# Patient Record
Sex: Male | Born: 1965
Health system: Southern US, Community
[De-identification: ages and names within clinical notes are randomized; demographics above are authoritative.]

## PROBLEM LIST (undated history)

## (undated) DIAGNOSIS — I10 Essential (primary) hypertension: Secondary | ICD-10-CM

## (undated) DIAGNOSIS — G255 Other chorea: Secondary | ICD-10-CM

## (undated) DIAGNOSIS — S8290XA Unspecified fracture of unspecified lower leg, initial encounter for closed fracture: Secondary | ICD-10-CM

## (undated) DIAGNOSIS — S129XXA Fracture of neck, unspecified, initial encounter: Secondary | ICD-10-CM

## (undated) DIAGNOSIS — S7290XA Unspecified fracture of unspecified femur, initial encounter for closed fracture: Secondary | ICD-10-CM

## (undated) HISTORY — DX: Unspecified fracture of unspecified lower leg, initial encounter for closed fracture: S82.90XA

## (undated) HISTORY — PX: ANKLE SURGERY: SHX546

## (undated) HISTORY — PX: BACK SURGERY: SHX140

## (undated) HISTORY — DX: Fracture of neck, unspecified, initial encounter: S12.9XXA

## (undated) HISTORY — DX: Other chorea: G25.5

## (undated) HISTORY — PX: KNEE SURGERY: SHX244

## (undated) HISTORY — DX: Essential (primary) hypertension: I10

## (undated) HISTORY — DX: Unspecified fracture of unspecified femur, initial encounter for closed fracture: S72.90XA

## (undated) HISTORY — PX: LEG SURGERY: SHX1003

---

## 1999-09-25 ENCOUNTER — Ambulatory Visit (HOSPITAL_COMMUNITY): Admission: RE | Admit: 1999-09-25 | Discharge: 1999-09-25 | Payer: Self-pay | Admitting: Orthopedic Surgery

## 2000-01-30 ENCOUNTER — Encounter: Admission: RE | Admit: 2000-01-30 | Discharge: 2000-02-21 | Payer: Self-pay | Admitting: *Deleted

## 2000-03-10 ENCOUNTER — Ambulatory Visit (HOSPITAL_COMMUNITY): Admission: RE | Admit: 2000-03-10 | Discharge: 2000-03-10 | Payer: Self-pay | Admitting: Family Medicine

## 2000-03-10 ENCOUNTER — Encounter: Payer: Self-pay | Admitting: Family Medicine

## 2001-08-12 ENCOUNTER — Encounter: Payer: Self-pay | Admitting: Orthopaedic Surgery

## 2001-08-12 ENCOUNTER — Ambulatory Visit (HOSPITAL_COMMUNITY): Admission: RE | Admit: 2001-08-12 | Discharge: 2001-08-12 | Payer: Self-pay | Admitting: Orthopaedic Surgery

## 2001-09-07 ENCOUNTER — Encounter: Admission: RE | Admit: 2001-09-07 | Discharge: 2001-09-07 | Payer: Self-pay | Admitting: Neurosurgery

## 2001-09-21 ENCOUNTER — Encounter: Admission: RE | Admit: 2001-09-21 | Discharge: 2001-09-21 | Payer: Self-pay | Admitting: Neurosurgery

## 2001-12-10 ENCOUNTER — Ambulatory Visit (HOSPITAL_COMMUNITY): Admission: RE | Admit: 2001-12-10 | Discharge: 2001-12-10 | Payer: Self-pay | Admitting: Neurosurgery

## 2002-01-12 ENCOUNTER — Ambulatory Visit (HOSPITAL_COMMUNITY): Admission: RE | Admit: 2002-01-12 | Discharge: 2002-01-12 | Payer: Self-pay | Admitting: General Surgery

## 2003-07-11 ENCOUNTER — Encounter: Payer: Self-pay | Admitting: Emergency Medicine

## 2003-07-12 ENCOUNTER — Inpatient Hospital Stay (HOSPITAL_COMMUNITY): Admission: EM | Admit: 2003-07-12 | Discharge: 2003-07-15 | Payer: Self-pay | Admitting: Emergency Medicine

## 2004-01-09 ENCOUNTER — Ambulatory Visit: Payer: Self-pay | Admitting: Family Medicine

## 2005-03-04 ENCOUNTER — Encounter: Admission: RE | Admit: 2005-03-04 | Discharge: 2005-04-18 | Payer: Self-pay | Admitting: Orthopaedic Surgery

## 2005-09-21 IMAGING — CR DG CHEST 2V
2 series · 2 of 2 positions shown · non-contrast
Comparison: 9689 hours same day.

CLINICAL DATA: Traumatic right pneumothorax.  
 TWO VIEW CHEST - 07/15/03 AT 1977 HOURS

[view not recorded (1 of 2)]
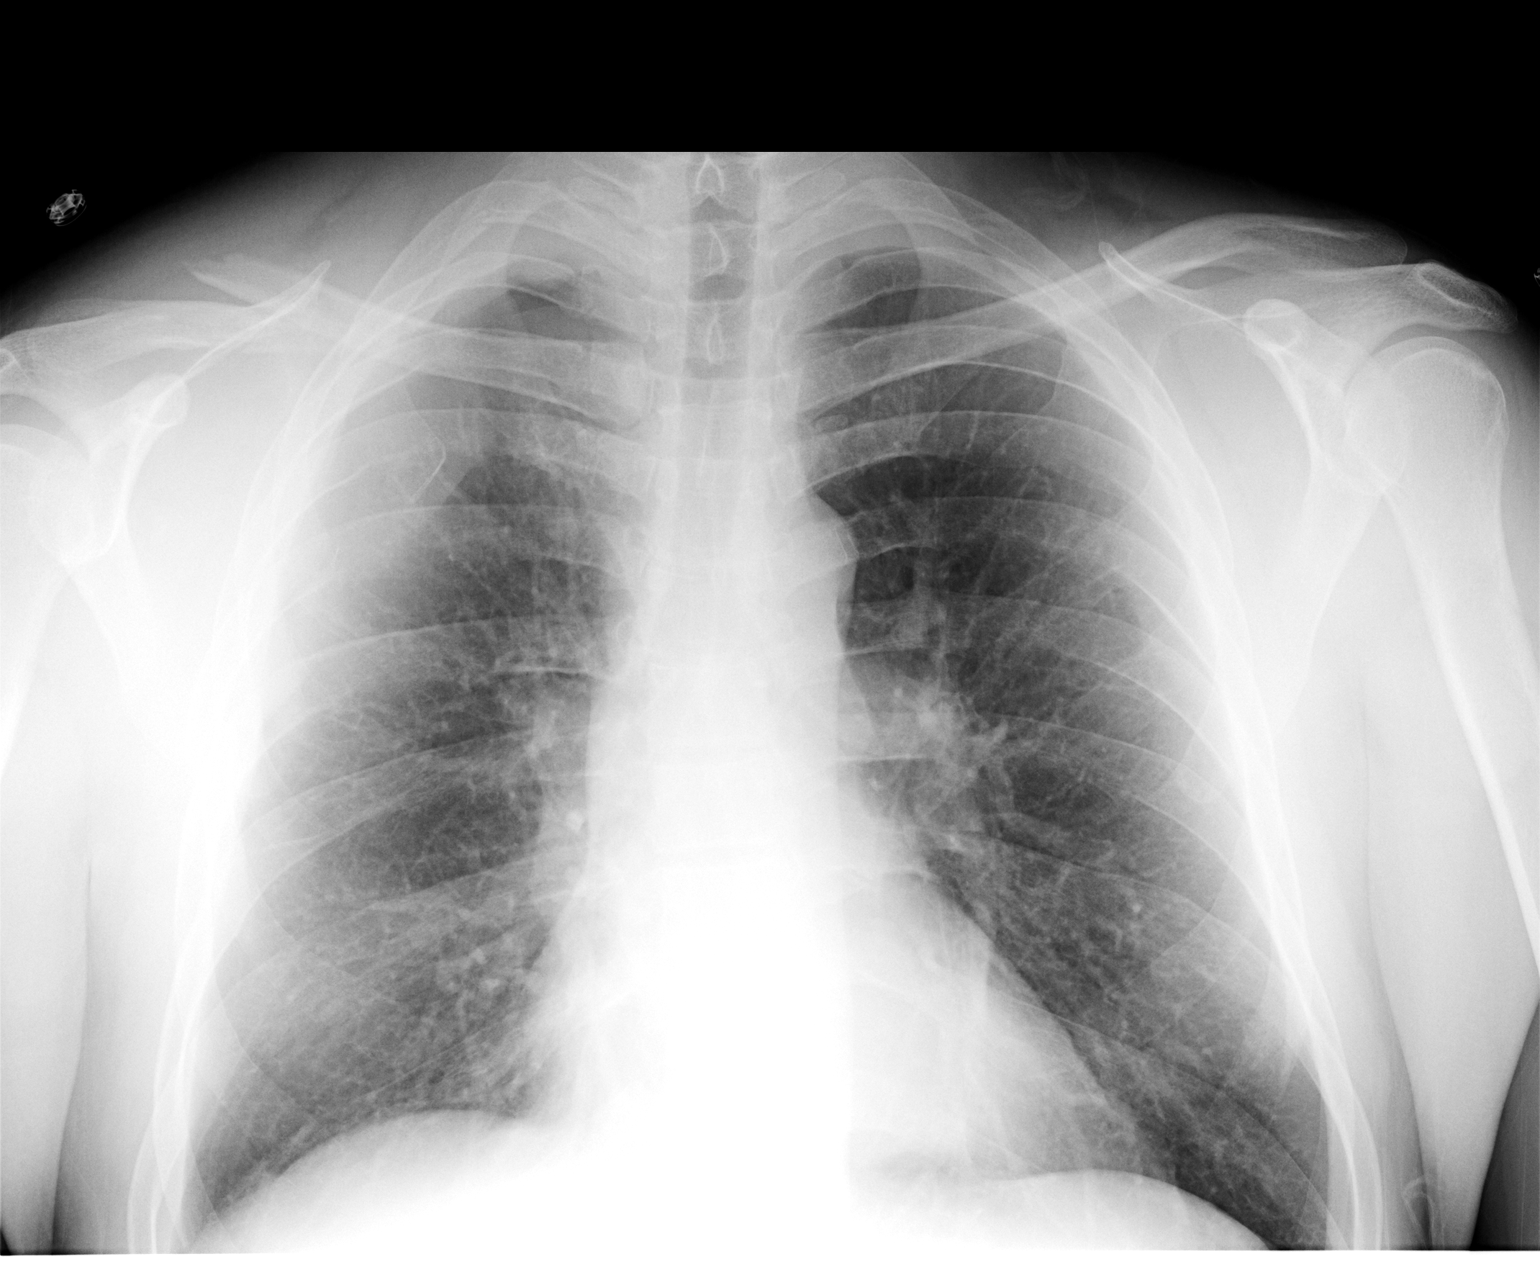

[view not recorded (2 of 2)]
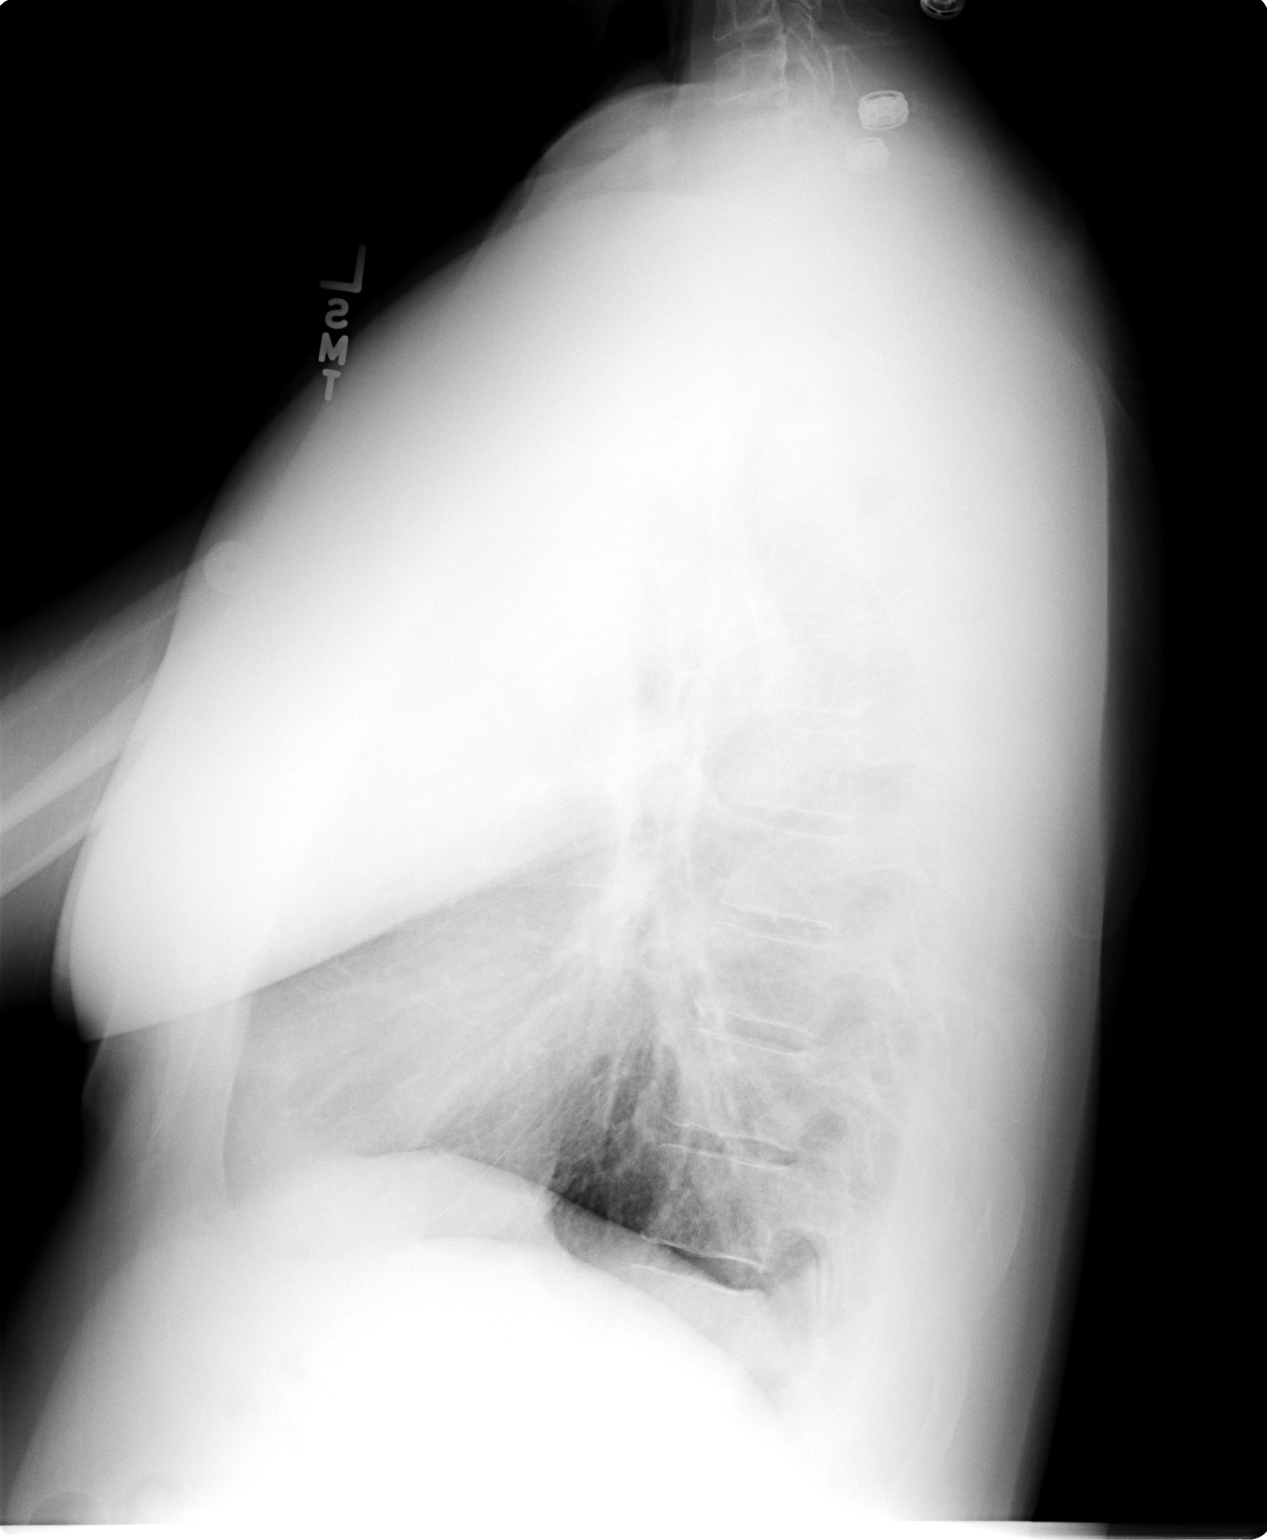

[2 of 2 positions shown; findings below may reference images not displayed]

Stable roughly 10% residual right apical pneumothorax.  Tiny stable right pleural effusion.
 IMPRESSION
 Stable pneumothorax on the right estimated to be 10% in volume.

## 2005-09-21 IMAGING — CR DG CHEST 2V
2 series · 2 of 2 positions shown · non-contrast
Comparison: none

CLINICAL DATA: Traumatic pneumothorax.
TWO VIEW CHEST   - 07/15/03 
Comparison to 07/14/03.

[view not recorded (1 of 2)]
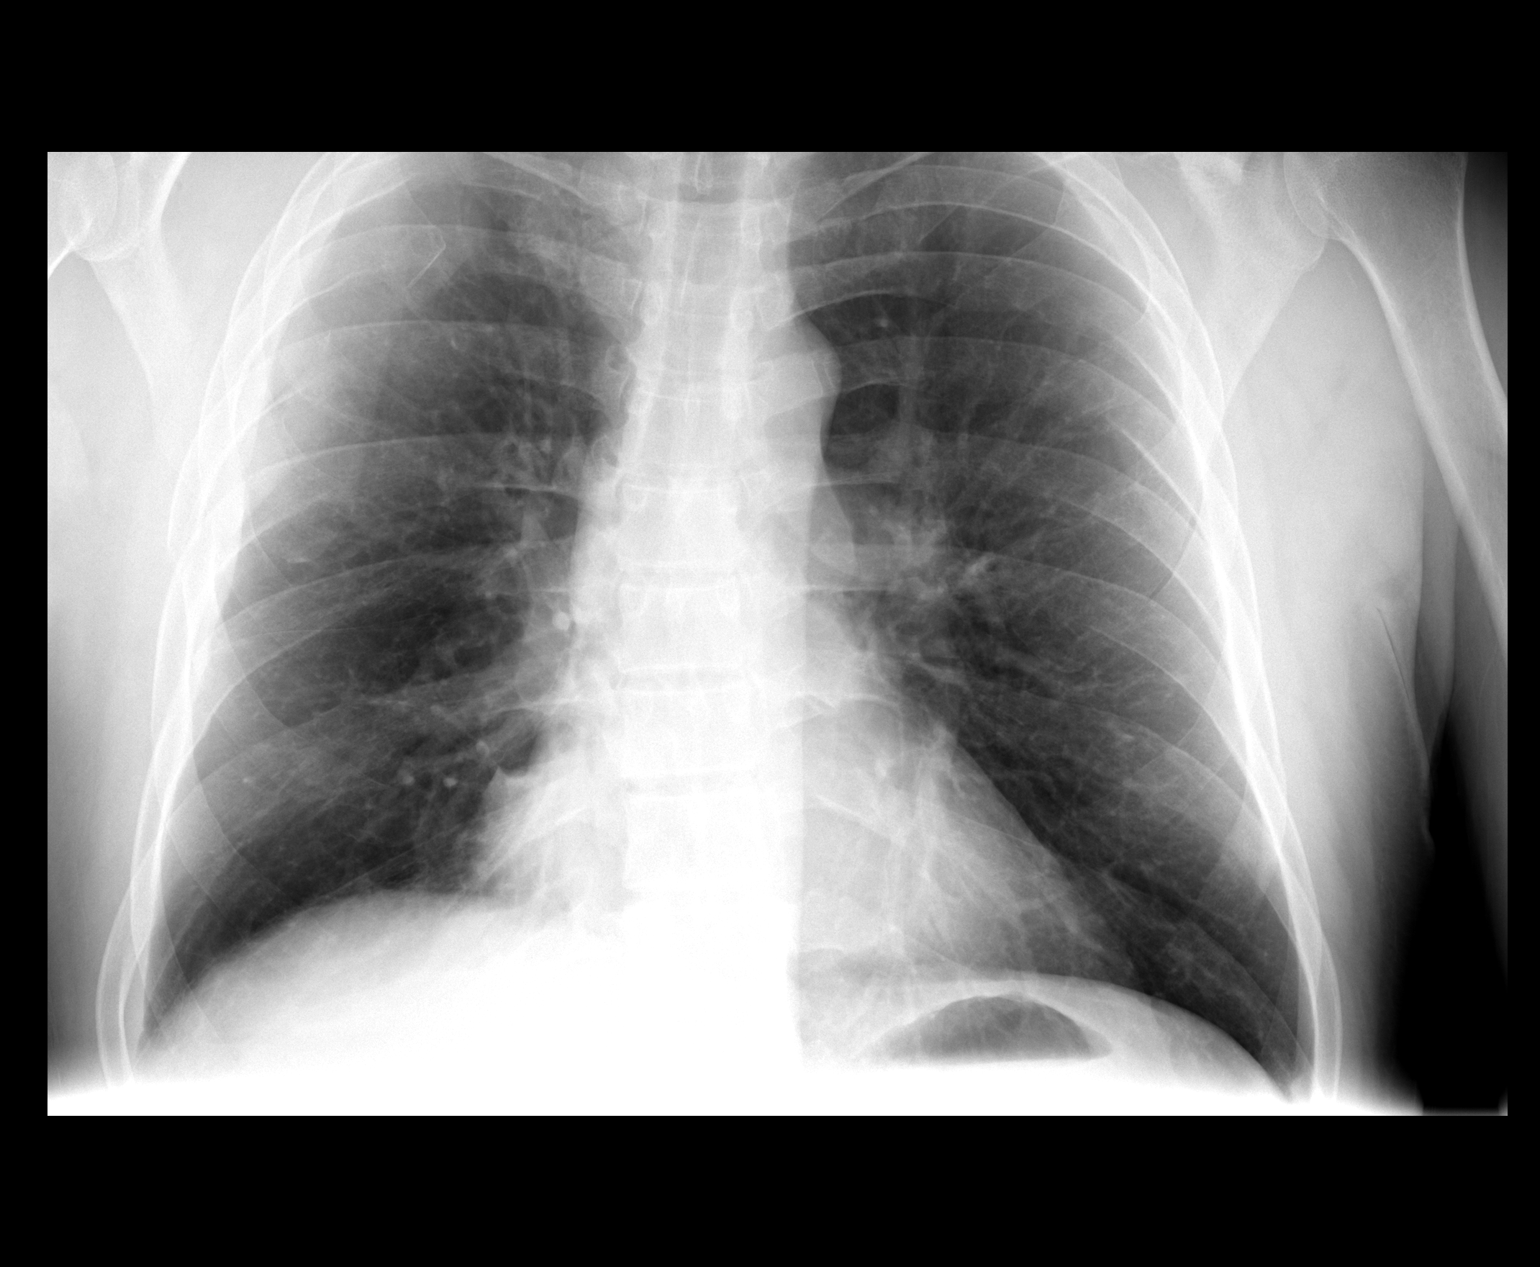

[view not recorded (2 of 2)]
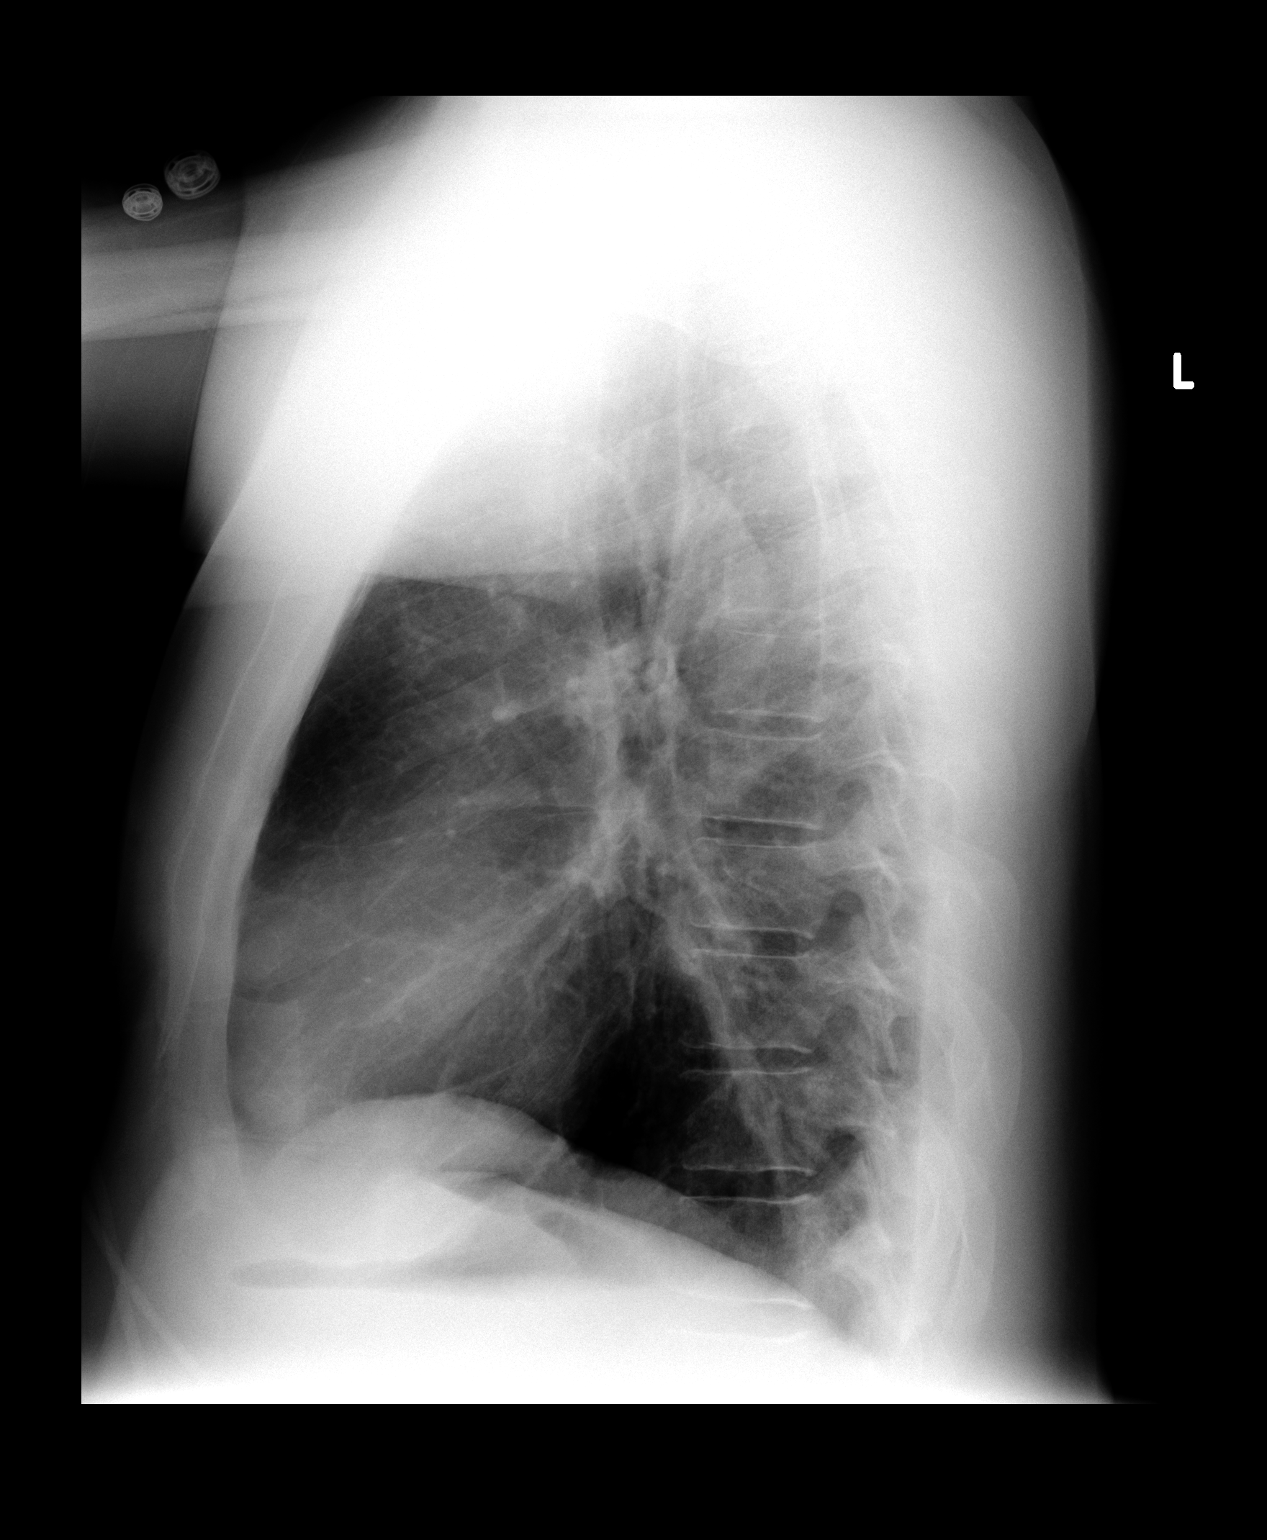

[2 of 2 positions shown; findings below may reference images not displayed]

FINDINGS: The right apical pneumothorax is slightly decreased from the last study now measuring 10 percent.   pleural fluid is again noted with the basilar hydropneumothorax portion is not as well visualized.  Right-sided rib fractures are again present.  New atelectasis/air space disease in the medial right lower lung is noted.
IMPRESSION
1.  Slight decrease in right apical pneumothorax.
2.  Stable right pleural fluid.
3.  Increasing medial right basilar atelectasis/air space disease.

## 2006-05-06 ENCOUNTER — Encounter: Admission: RE | Admit: 2006-05-06 | Discharge: 2006-06-08 | Payer: Self-pay | Admitting: *Deleted

## 2009-11-27 ENCOUNTER — Encounter
Admission: RE | Admit: 2009-11-27 | Discharge: 2010-02-07 | Payer: Self-pay | Source: Home / Self Care | Attending: Orthopedic Surgery | Admitting: Orthopedic Surgery

## 2010-03-26 ENCOUNTER — Ambulatory Visit: Payer: Medicaid Other | Attending: Clinical | Admitting: Physical Therapy

## 2010-03-26 DIAGNOSIS — R5381 Other malaise: Secondary | ICD-10-CM | POA: Insufficient documentation

## 2010-03-26 DIAGNOSIS — IMO0001 Reserved for inherently not codable concepts without codable children: Secondary | ICD-10-CM | POA: Insufficient documentation

## 2010-03-26 DIAGNOSIS — R262 Difficulty in walking, not elsewhere classified: Secondary | ICD-10-CM | POA: Insufficient documentation

## 2010-03-26 DIAGNOSIS — M25569 Pain in unspecified knee: Secondary | ICD-10-CM | POA: Insufficient documentation

## 2010-03-28 ENCOUNTER — Ambulatory Visit: Payer: Medicaid Other | Admitting: Physical Therapy

## 2010-04-02 ENCOUNTER — Ambulatory Visit: Payer: Medicaid Other | Admitting: Physical Therapy

## 2010-04-10 ENCOUNTER — Ambulatory Visit: Payer: Medicaid Other | Admitting: Physical Therapy

## 2010-04-18 ENCOUNTER — Ambulatory Visit: Payer: Medicaid Other | Admitting: Physical Therapy
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
## 2010-06-28 NOTE — Op Note (Signed)
NAME:  Keith Robertson, Keith Robertson                           ACCOUNT NO.:  [id number]   MEDICAL RECORD NO.:  [id number]                   PATIENT TYPE:  OIB   LOCATION:  3011                                 FACILITY:  [facility]   PHYSICIAN:  Cristi Loron, M.D.            DATE OF BIRTH:  05/05/1965   DATE OF PROCEDURE:  01/12/2002  DATE OF DISCHARGE:                                 OPERATIVE REPORT   PREOPERATIVE DIAGNOSIS:  Right L5-S1 herniated nucleus pulposus, stenosis,  right S1 radiculopathy, lumbago.   POSTOPERATIVE DIAGNOSIS:  Right L5-S1 herniated nucleus pulposus, stenosis,  right S1 radiculopathy, lumbago.   OPERATION PERFORMED:  Right L5-S1 microdiskectomy using microdissection.   SURGEON:  Cristi Loron, M.D.   ASSISTANT:  Hewitt Shorts, M.D.   ANESTHESIA:  General endotracheal.   ESTIMATED BLOOD LOSS:  Less than 100 cc.   SPECIMENS:  None.   DRAINS:  None.   COMPLICATIONS:  None.   INDICATIONS FOR PROCEDURE:  The patient is a 44 year old male who suffers  from back and left leg pain consistent with a right S1 radiculopathy.  He  failed medical management and was worked up with a lumbar MRI and a lumbar  myelo-CT which demonstrated herniated disk at L5-S1 on the right.  The  patient weighed the risks, benefits and alternatives of surgery and decided  to proceed with a microdiskectomy.   DESCRIPTION OF PROCEDURE:  The patient was brought to the operating room by  the anesthesia team.  General endotracheal anesthesia was induced.  The  patient was turned into prone position on the Wilson frame.  His lumbosacral  region was then shaved and prepared with Betadine scrub and Betadine  solution.  Sterile drapes were applied and I injected the area to be incised  with Marcaine with epinephrine solution. Using a scalpel, I made a linear  midline incision over the L5-S1 interspace and then used electrocautery to  dissect down to the thoracolumbar fascia and divide  the fascia just to the  right of midline and performed a right-sided subperiosteal dissection,  stripped the paraspinous musculature from the right spinous processes and  lamina of L5 and the upper sacrum.   We then inserted the [facility] retractor for exposure and then took an  intraoperative radiograph to confirm our location.  We then brought the  operating microscope into the field and its magnification and illumination,  completed the microdissection and decompression.  We used a high speed drill  to perform a right L5 laminotomy.  I widened the laminotomy with a Kerrison  punch and removed the ligamentum flavum at L5-S1 on the right and then  performed a foraminotomy about the right S1 nerve root.  We then used  microdissection to free up the nerve root from the epidural tissue and Dr.  Newell Coral gently retracted the nerve root medially with the D'Errico  retractor.  This exposed a moderate  sized underlying herniated disk which  was compressing the nerve root from the ventral direction.  We incised the  intervertebral disk with the 15 blade scalpel and then performed a partial  diskectomy using the pituitary forceps, Epstein and Scoville curets.  After  we were satisfied with the diskectomy, we used the osteophyte tool to remove  some redundant ligament and spondylosis from the vertebral end plates at L5  and S1 on the right.  We then palpate along the ventral surface of the  thecal sac and along the right S1 nerve root and noted the neural structures  to be well decompressed.  We obtained stringent hemostasis with bipolar  electrocautery.  The wound was then copiously irrigated out with bacitracin  solution.  The solution was removed as was the [facility].  We  reapproximated the patient's thoracolumbar fascia with interrupted #1 Vicryl  suture, subcutaneous tissue with interrupted 2-0 Vicryl suture and the skin  with Steri-Strips and benzoin.  The wound was then coated  with bacitracin  ointment and sterile dressings applied.  The drapes were removed.  The  patient was subsequently returned to supine position where he was extubated  by the anesthesia team and transported to the post anesthesia care unit in  stable condition.  Sponge, needle and instrument counts were correct at the  end of this case.                                                Cristi Loron, M.D.    JDJ/MEDQ  D:  01/12/2002  T:  01/12/2002  Job:  [id number]

## 2010-09-26 ENCOUNTER — Ambulatory Visit: Payer: Self-pay | Attending: Clinical | Admitting: Physical Therapy

## 2010-09-26 DIAGNOSIS — M25569 Pain in unspecified knee: Secondary | ICD-10-CM | POA: Insufficient documentation

## 2010-09-26 DIAGNOSIS — R5381 Other malaise: Secondary | ICD-10-CM | POA: Insufficient documentation

## 2010-09-26 DIAGNOSIS — R262 Difficulty in walking, not elsewhere classified: Secondary | ICD-10-CM | POA: Insufficient documentation

## 2010-09-26 DIAGNOSIS — IMO0001 Reserved for inherently not codable concepts without codable children: Secondary | ICD-10-CM | POA: Insufficient documentation

## 2010-09-30 ENCOUNTER — Ambulatory Visit: Payer: Self-pay | Admitting: Physical Therapy

## 2010-10-03 ENCOUNTER — Ambulatory Visit: Payer: Self-pay | Admitting: Physical Therapy

## 2010-10-07 ENCOUNTER — Encounter: Payer: Medicaid Other | Admitting: Physical Therapy

## 2010-10-11 ENCOUNTER — Encounter: Payer: Medicaid Other | Admitting: Physical Therapy

## 2010-12-20 ENCOUNTER — Telehealth: Payer: Self-pay | Admitting: Internal Medicine

## 2010-12-20 NOTE — Telephone Encounter (Signed)
Kelly from [facility] called and stated she has spoke with you about the patient, and she just wanted to make sure you have received the paperwork that needs to be filled out and when it is fax it to [phone]. Thanks

## 2010-12-23 NOTE — Telephone Encounter (Signed)
Information faxed to Sallyanne Kuster at [phone].  Included was OV note from 11/18/2010, order dated 12/17/2010, and addendum letter.

## 2012-09-13 DIAGNOSIS — S129XXA Fracture of neck, unspecified, initial encounter: Secondary | ICD-10-CM | POA: Insufficient documentation

## 2012-09-13 DIAGNOSIS — S15102A Unspecified injury of left vertebral artery, initial encounter: Secondary | ICD-10-CM | POA: Insufficient documentation

## 2012-10-05 ENCOUNTER — Ambulatory Visit: Payer: Medicare Other | Attending: Family Medicine | Admitting: Physical Therapy

## 2012-10-05 DIAGNOSIS — R5381 Other malaise: Secondary | ICD-10-CM | POA: Insufficient documentation

## 2012-10-05 DIAGNOSIS — G540 Brachial plexus disorders: Secondary | ICD-10-CM | POA: Insufficient documentation

## 2012-10-05 DIAGNOSIS — M25519 Pain in unspecified shoulder: Secondary | ICD-10-CM | POA: Insufficient documentation

## 2012-10-05 DIAGNOSIS — IMO0001 Reserved for inherently not codable concepts without codable children: Secondary | ICD-10-CM | POA: Insufficient documentation

## 2012-10-05 DIAGNOSIS — M25512 Pain in left shoulder: Secondary | ICD-10-CM | POA: Insufficient documentation

## 2012-10-08 ENCOUNTER — Ambulatory Visit: Payer: Medicare Other

## 2012-10-13 ENCOUNTER — Ambulatory Visit: Payer: Medicare Other | Attending: Family Medicine

## 2012-10-13 DIAGNOSIS — M25519 Pain in unspecified shoulder: Secondary | ICD-10-CM | POA: Insufficient documentation

## 2012-10-13 DIAGNOSIS — IMO0001 Reserved for inherently not codable concepts without codable children: Secondary | ICD-10-CM | POA: Insufficient documentation

## 2012-10-13 DIAGNOSIS — R5381 Other malaise: Secondary | ICD-10-CM | POA: Insufficient documentation

## 2012-10-14 ENCOUNTER — Ambulatory Visit: Payer: Medicare Other

## 2012-10-18 ENCOUNTER — Encounter: Payer: Medicare Other | Admitting: Physical Therapy

## 2012-10-21 ENCOUNTER — Encounter: Payer: Medicare Other | Admitting: Physical Therapy

## 2012-11-08 DIAGNOSIS — M7542 Impingement syndrome of left shoulder: Secondary | ICD-10-CM | POA: Insufficient documentation

## 2012-11-18 ENCOUNTER — Ambulatory Visit: Payer: Medicare Other | Attending: Family Medicine

## 2012-11-18 DIAGNOSIS — M25519 Pain in unspecified shoulder: Secondary | ICD-10-CM | POA: Insufficient documentation

## 2012-11-18 DIAGNOSIS — IMO0001 Reserved for inherently not codable concepts without codable children: Secondary | ICD-10-CM | POA: Insufficient documentation

## 2012-11-18 DIAGNOSIS — R5381 Other malaise: Secondary | ICD-10-CM | POA: Insufficient documentation

## 2012-11-23 ENCOUNTER — Ambulatory Visit: Payer: Medicare Other

## 2012-11-25 ENCOUNTER — Ambulatory Visit: Payer: Medicare Other

## 2012-11-30 ENCOUNTER — Ambulatory Visit: Payer: Medicare Other

## 2012-12-02 ENCOUNTER — Ambulatory Visit: Payer: Medicare Other | Admitting: Physical Therapy

## 2012-12-07 ENCOUNTER — Ambulatory Visit: Payer: Medicare Other | Admitting: *Deleted

## 2012-12-09 ENCOUNTER — Ambulatory Visit: Payer: Medicare Other | Admitting: *Deleted

## 2012-12-14 ENCOUNTER — Ambulatory Visit: Payer: Medicare Other | Attending: *Deleted | Admitting: Physical Therapy

## 2012-12-14 DIAGNOSIS — R5381 Other malaise: Secondary | ICD-10-CM | POA: Insufficient documentation

## 2012-12-14 DIAGNOSIS — IMO0001 Reserved for inherently not codable concepts without codable children: Secondary | ICD-10-CM | POA: Insufficient documentation

## 2012-12-14 DIAGNOSIS — M25519 Pain in unspecified shoulder: Secondary | ICD-10-CM | POA: Insufficient documentation

## 2012-12-14 DIAGNOSIS — F17200 Nicotine dependence, unspecified, uncomplicated: Secondary | ICD-10-CM | POA: Insufficient documentation

## 2012-12-16 ENCOUNTER — Ambulatory Visit: Payer: Medicare Other | Admitting: Physical Therapy

## 2012-12-21 ENCOUNTER — Ambulatory Visit: Payer: Medicare Other | Admitting: *Deleted

## 2012-12-23 ENCOUNTER — Ambulatory Visit: Payer: Medicare Other | Admitting: Physical Therapy

## 2012-12-27 ENCOUNTER — Ambulatory Visit: Payer: Medicare Other | Admitting: Physical Therapy

## 2012-12-30 ENCOUNTER — Ambulatory Visit: Payer: Medicare Other | Admitting: Physical Therapy

## 2013-01-03 ENCOUNTER — Encounter: Payer: Medicare Other | Admitting: *Deleted

## 2013-01-18 ENCOUNTER — Encounter: Payer: Medicare Other | Admitting: Physical Therapy

## 2013-01-20 ENCOUNTER — Encounter: Payer: Medicare Other | Admitting: Physical Therapy

## 2014-04-10 DIAGNOSIS — Z79891 Long term (current) use of opiate analgesic: Secondary | ICD-10-CM | POA: Diagnosis not present

## 2014-04-10 DIAGNOSIS — E785 Hyperlipidemia, unspecified: Secondary | ICD-10-CM | POA: Diagnosis not present

## 2014-04-10 DIAGNOSIS — G5732 Lesion of lateral popliteal nerve, left lower limb: Secondary | ICD-10-CM | POA: Diagnosis not present

## 2014-04-10 DIAGNOSIS — G8929 Other chronic pain: Secondary | ICD-10-CM | POA: Diagnosis not present

## 2014-05-29 DIAGNOSIS — S80812A Abrasion, left lower leg, initial encounter: Secondary | ICD-10-CM | POA: Diagnosis not present

## 2014-05-29 DIAGNOSIS — S40811A Abrasion of right upper arm, initial encounter: Secondary | ICD-10-CM | POA: Diagnosis not present

## 2014-05-29 DIAGNOSIS — S80811A Abrasion, right lower leg, initial encounter: Secondary | ICD-10-CM | POA: Diagnosis not present

## 2014-05-29 DIAGNOSIS — F17200 Nicotine dependence, unspecified, uncomplicated: Secondary | ICD-10-CM | POA: Diagnosis not present

## 2014-05-29 DIAGNOSIS — M25561 Pain in right knee: Secondary | ICD-10-CM | POA: Diagnosis not present

## 2014-05-29 DIAGNOSIS — S30811A Abrasion of abdominal wall, initial encounter: Secondary | ICD-10-CM | POA: Diagnosis not present

## 2014-05-29 DIAGNOSIS — S40812A Abrasion of left upper arm, initial encounter: Secondary | ICD-10-CM | POA: Diagnosis not present

## 2014-05-29 DIAGNOSIS — S81011A Laceration without foreign body, right knee, initial encounter: Secondary | ICD-10-CM | POA: Diagnosis not present

## 2014-05-29 DIAGNOSIS — S20211A Contusion of right front wall of thorax, initial encounter: Secondary | ICD-10-CM | POA: Diagnosis not present

## 2014-05-29 DIAGNOSIS — R079 Chest pain, unspecified: Secondary | ICD-10-CM | POA: Diagnosis not present

## 2014-06-12 DIAGNOSIS — R2241 Localized swelling, mass and lump, right lower limb: Secondary | ICD-10-CM | POA: Diagnosis not present

## 2014-06-12 DIAGNOSIS — M7989 Other specified soft tissue disorders: Secondary | ICD-10-CM | POA: Diagnosis not present

## 2014-06-12 DIAGNOSIS — M79604 Pain in right leg: Secondary | ICD-10-CM | POA: Diagnosis not present

## 2014-06-12 DIAGNOSIS — T814XXD Infection following a procedure, subsequent encounter: Secondary | ICD-10-CM | POA: Diagnosis not present

## 2014-06-12 DIAGNOSIS — L03115 Cellulitis of right lower limb: Secondary | ICD-10-CM | POA: Diagnosis not present

## 2014-06-12 DIAGNOSIS — S8991XA Unspecified injury of right lower leg, initial encounter: Secondary | ICD-10-CM | POA: Diagnosis not present

## 2014-06-13 DIAGNOSIS — T814XXA Infection following a procedure, initial encounter: Secondary | ICD-10-CM | POA: Diagnosis not present

## 2014-06-15 DIAGNOSIS — T814XXA Infection following a procedure, initial encounter: Secondary | ICD-10-CM | POA: Diagnosis not present

## 2014-06-16 DIAGNOSIS — T814XXA Infection following a procedure, initial encounter: Secondary | ICD-10-CM | POA: Diagnosis not present

## 2014-06-19 DIAGNOSIS — T814XXA Infection following a procedure, initial encounter: Secondary | ICD-10-CM | POA: Diagnosis not present

## 2014-06-22 DIAGNOSIS — T814XXA Infection following a procedure, initial encounter: Secondary | ICD-10-CM | POA: Diagnosis not present

## 2014-06-26 DIAGNOSIS — T814XXA Infection following a procedure, initial encounter: Secondary | ICD-10-CM | POA: Diagnosis not present

## 2014-09-26 DIAGNOSIS — S129XXA Fracture of neck, unspecified, initial encounter: Secondary | ICD-10-CM | POA: Diagnosis not present

## 2014-09-26 DIAGNOSIS — S4992XA Unspecified injury of left shoulder and upper arm, initial encounter: Secondary | ICD-10-CM | POA: Diagnosis not present

## 2014-09-26 DIAGNOSIS — G8929 Other chronic pain: Secondary | ICD-10-CM | POA: Diagnosis not present

## 2014-09-26 DIAGNOSIS — Z79891 Long term (current) use of opiate analgesic: Secondary | ICD-10-CM | POA: Diagnosis not present

## 2014-11-17 DIAGNOSIS — R739 Hyperglycemia, unspecified: Secondary | ICD-10-CM | POA: Diagnosis not present

## 2014-11-17 DIAGNOSIS — E785 Hyperlipidemia, unspecified: Secondary | ICD-10-CM | POA: Diagnosis not present

## 2014-11-17 DIAGNOSIS — E039 Hypothyroidism, unspecified: Secondary | ICD-10-CM | POA: Diagnosis not present

## 2014-11-17 DIAGNOSIS — Z0000 Encounter for general adult medical examination without abnormal findings: Secondary | ICD-10-CM | POA: Diagnosis not present

## 2014-12-28 DIAGNOSIS — Z79891 Long term (current) use of opiate analgesic: Secondary | ICD-10-CM | POA: Diagnosis not present

## 2014-12-28 DIAGNOSIS — K769 Liver disease, unspecified: Secondary | ICD-10-CM | POA: Diagnosis not present

## 2014-12-28 DIAGNOSIS — G8929 Other chronic pain: Secondary | ICD-10-CM | POA: Diagnosis not present

## 2014-12-28 DIAGNOSIS — Z23 Encounter for immunization: Secondary | ICD-10-CM | POA: Diagnosis not present

## 2014-12-28 DIAGNOSIS — E039 Hypothyroidism, unspecified: Secondary | ICD-10-CM | POA: Diagnosis not present

## 2015-04-03 DIAGNOSIS — M2550 Pain in unspecified joint: Secondary | ICD-10-CM | POA: Diagnosis not present

## 2015-04-03 DIAGNOSIS — R609 Edema, unspecified: Secondary | ICD-10-CM | POA: Diagnosis not present

## 2015-04-03 DIAGNOSIS — I10 Essential (primary) hypertension: Secondary | ICD-10-CM | POA: Diagnosis not present

## 2015-04-05 DIAGNOSIS — M2550 Pain in unspecified joint: Secondary | ICD-10-CM | POA: Diagnosis not present

## 2015-04-05 DIAGNOSIS — I10 Essential (primary) hypertension: Secondary | ICD-10-CM | POA: Diagnosis not present

## 2015-04-05 DIAGNOSIS — R609 Edema, unspecified: Secondary | ICD-10-CM | POA: Diagnosis not present

## 2015-04-17 DIAGNOSIS — Z8269 Family history of other diseases of the musculoskeletal system and connective tissue: Secondary | ICD-10-CM | POA: Diagnosis not present

## 2015-04-17 DIAGNOSIS — M2550 Pain in unspecified joint: Secondary | ICD-10-CM | POA: Diagnosis not present

## 2015-04-17 DIAGNOSIS — M7989 Other specified soft tissue disorders: Secondary | ICD-10-CM | POA: Diagnosis not present

## 2015-04-19 DIAGNOSIS — M2550 Pain in unspecified joint: Secondary | ICD-10-CM | POA: Diagnosis not present

## 2015-04-19 DIAGNOSIS — Z8269 Family history of other diseases of the musculoskeletal system and connective tissue: Secondary | ICD-10-CM | POA: Diagnosis not present

## 2015-04-19 DIAGNOSIS — M7989 Other specified soft tissue disorders: Secondary | ICD-10-CM | POA: Diagnosis not present

## 2015-05-14 DIAGNOSIS — R5383 Other fatigue: Secondary | ICD-10-CM | POA: Diagnosis not present

## 2015-05-14 DIAGNOSIS — M7989 Other specified soft tissue disorders: Secondary | ICD-10-CM | POA: Diagnosis not present

## 2015-05-14 DIAGNOSIS — M2550 Pain in unspecified joint: Secondary | ICD-10-CM | POA: Diagnosis not present

## 2015-06-05 DIAGNOSIS — M2550 Pain in unspecified joint: Secondary | ICD-10-CM | POA: Diagnosis not present

## 2015-06-05 DIAGNOSIS — R5383 Other fatigue: Secondary | ICD-10-CM | POA: Diagnosis not present

## 2015-06-05 DIAGNOSIS — R200 Anesthesia of skin: Secondary | ICD-10-CM | POA: Diagnosis not present

## 2015-06-05 DIAGNOSIS — M7989 Other specified soft tissue disorders: Secondary | ICD-10-CM | POA: Diagnosis not present

## 2015-07-23 DIAGNOSIS — S142XXA Injury of nerve root of cervical spine, initial encounter: Secondary | ICD-10-CM | POA: Diagnosis not present

## 2015-07-23 DIAGNOSIS — M9901 Segmental and somatic dysfunction of cervical region: Secondary | ICD-10-CM | POA: Diagnosis not present

## 2015-07-23 DIAGNOSIS — S129XXA Fracture of neck, unspecified, initial encounter: Secondary | ICD-10-CM | POA: Diagnosis not present

## 2015-07-23 DIAGNOSIS — M542 Cervicalgia: Secondary | ICD-10-CM | POA: Diagnosis not present

## 2015-10-18 ENCOUNTER — Encounter: Payer: Self-pay | Admitting: Physician Assistant

## 2015-10-18 ENCOUNTER — Ambulatory Visit (INDEPENDENT_AMBULATORY_CARE_PROVIDER_SITE_OTHER): Payer: Medicare Other | Admitting: Physician Assistant

## 2015-10-18 VITALS — BP 117/76 | HR 111 | Temp 97.2°F | Ht 70.0 in | Wt 233.6 lb

## 2015-10-18 DIAGNOSIS — S8410XA Injury of peroneal nerve at lower leg level, unspecified leg, initial encounter: Secondary | ICD-10-CM | POA: Insufficient documentation

## 2015-10-18 DIAGNOSIS — G540 Brachial plexus disorders: Secondary | ICD-10-CM

## 2015-10-18 DIAGNOSIS — S8412XA Injury of peroneal nerve at lower leg level, left leg, initial encounter: Secondary | ICD-10-CM | POA: Diagnosis not present

## 2015-10-18 DIAGNOSIS — S12200S Unspecified displaced fracture of third cervical vertebra, sequela: Secondary | ICD-10-CM

## 2015-10-18 DIAGNOSIS — Z6833 Body mass index (BMI) 33.0-33.9, adult: Secondary | ICD-10-CM | POA: Diagnosis not present

## 2015-10-18 MED ORDER — OXYCODONE HCL 15 MG PO TABS: ORAL_TABLET | ORAL | 0 refills | Status: DC

## 2015-10-18 MED ORDER — OXYCODONE HCL 15 MG PO TABS: 15.0000 mg | ORAL_TABLET | Freq: Four times a day (QID) | ORAL | 0 refills | Status: DC

## 2015-10-18 NOTE — Patient Instructions (Signed)
Anterior Cervical Fusion, Care After °Refer to this sheet in the next few weeks. These instructions provide you with information on caring for yourself after your procedure. Your health care provider may also give you more specific instructions. Your treatment has been planned according to current medical practices, but problems sometimes occur. Call your health care provider if you have any problems or questions after your procedure. °WHAT TO EXPECT AFTER THE PROCEDURE °After your procedure, it is typical to have the following:  °· Pain. °· Numbness. °· Weakness. °HOME CARE INSTRUCTIONS  °It can take 6-12 months to recover fully from this procedure. Make sure to follow all of your health care provider's instructions carefully.  °· Only take medicines as directed by your health care provider. °· You can eat what you usually do. °· Your activities will be limited for the first 3-4 months. In general: °¨ It is fine to walk and climb stairs. °¨ Do not lift anything weighing more than 10 lb (4.5 kg). °¨ Do not lift objects over your head. °¨ Do not drive until your health care provider says it is okay. °· Take showers instead of baths until directed by your health care provider. °· If you have to wear a cervical collar, take it off only as directed by your health care provider. You may be able to take it off to eat and shower. °· Change the bandage as directed by your health care provider. °· Return to your health care provider to have sutures or staples removed as directed by your health care provider. °· Follow your health care provider's instructions for physical therapy. °· You may apply ice to the repaired area:   °¨ Put ice in a plastic bag. °¨ Place a towel between your skin and the bag. °¨ Leave the ice on for 20 minutes, 2-3 times a day.   °· Keep all follow-up appointments. °SEEK MEDICAL CARE IF: °· You have redness, swelling, or pain in your cut (incision) that is getting worse. °· You have pus coming from  the incision. °· You have a fever. °· There is a bad smell coming from the incision or bandage. °· The edges of the incision break open after sutures or staples are taken out. °· Your pain medicine is not helping. °· You have swelling in your calf or leg. °· You seem to be getting worse instead of better.   °SEEK IMMEDIATE MEDICAL CARE IF: °· You develop shortness of breath or chest pain. °· You have trouble swallowing. °· You have pain, numbness, or weakness that is getting worse. °  °This information is not intended to replace advice given to you by your health care provider. Make sure you discuss any questions you have with your health care provider. °  °Document Released: 09/11/2003 Document Revised: 02/01/2013 Document Reviewed: 11/23/2012 °Elsevier Interactive Patient Education ©2016 [employer] Inc. ° °
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
## 2015-10-22 DIAGNOSIS — Z6833 Body mass index (BMI) 33.0-33.9, adult: Secondary | ICD-10-CM | POA: Insufficient documentation

## 2015-10-22 NOTE — Progress Notes (Signed)
Completed. Crazy thing is, I know I did a long dragon note, must have not had it in the box.  Thanks for letting me know. I will be more aware of this issue. CIGNAngel

## 2015-11-07 ENCOUNTER — Encounter: Payer: Self-pay | Admitting: Physician Assistant

## 2015-11-09 MED ORDER — MELOXICAM 15 MG PO TABS: 15.0000 mg | ORAL_TABLET | Freq: Every day | ORAL | 11 refills | Status: DC

## 2015-11-21 DIAGNOSIS — S12390S Other displaced fracture of fourth cervical vertebra, sequela: Secondary | ICD-10-CM | POA: Diagnosis not present

## 2015-11-21 DIAGNOSIS — S12590S Other displaced fracture of sixth cervical vertebra, sequela: Secondary | ICD-10-CM | POA: Diagnosis not present

## 2015-11-21 DIAGNOSIS — M542 Cervicalgia: Secondary | ICD-10-CM | POA: Diagnosis not present

## 2015-11-21 DIAGNOSIS — S12490S Other displaced fracture of fifth cervical vertebra, sequela: Secondary | ICD-10-CM | POA: Diagnosis not present

## 2015-11-21 DIAGNOSIS — M50321 Other cervical disc degeneration at C4-C5 level: Secondary | ICD-10-CM | POA: Diagnosis not present

## 2015-11-21 DIAGNOSIS — S129XXD Fracture of neck, unspecified, subsequent encounter: Secondary | ICD-10-CM | POA: Diagnosis not present

## 2015-12-11 DIAGNOSIS — M2578 Osteophyte, vertebrae: Secondary | ICD-10-CM | POA: Diagnosis not present

## 2015-12-11 DIAGNOSIS — M542 Cervicalgia: Secondary | ICD-10-CM | POA: Diagnosis not present

## 2015-12-11 DIAGNOSIS — M9971 Connective tissue and disc stenosis of intervertebral foramina of cervical region: Secondary | ICD-10-CM | POA: Diagnosis not present

## 2015-12-11 DIAGNOSIS — M4802 Spinal stenosis, cervical region: Secondary | ICD-10-CM | POA: Diagnosis not present

## 2015-12-13 DIAGNOSIS — M542 Cervicalgia: Secondary | ICD-10-CM | POA: Diagnosis not present

## 2015-12-21 DIAGNOSIS — M542 Cervicalgia: Secondary | ICD-10-CM | POA: Diagnosis not present

## 2015-12-26 DIAGNOSIS — M542 Cervicalgia: Secondary | ICD-10-CM | POA: Diagnosis not present

## 2016-01-01 DIAGNOSIS — M542 Cervicalgia: Secondary | ICD-10-CM | POA: Diagnosis not present

## 2016-01-02 DIAGNOSIS — M542 Cervicalgia: Secondary | ICD-10-CM | POA: Diagnosis not present

## 2016-01-07 DIAGNOSIS — M542 Cervicalgia: Secondary | ICD-10-CM | POA: Diagnosis not present

## 2016-01-10 DIAGNOSIS — M542 Cervicalgia: Secondary | ICD-10-CM | POA: Diagnosis not present

## 2016-01-14 DIAGNOSIS — M542 Cervicalgia: Secondary | ICD-10-CM | POA: Diagnosis not present

## 2016-01-17 DIAGNOSIS — M542 Cervicalgia: Secondary | ICD-10-CM | POA: Diagnosis not present

## 2016-01-18 ENCOUNTER — Ambulatory Visit (INDEPENDENT_AMBULATORY_CARE_PROVIDER_SITE_OTHER): Payer: Medicare Other | Admitting: Physician Assistant

## 2016-01-18 ENCOUNTER — Encounter: Payer: Self-pay | Admitting: Physician Assistant

## 2016-01-18 DIAGNOSIS — Z23 Encounter for immunization: Secondary | ICD-10-CM

## 2016-01-18 DIAGNOSIS — S12200S Unspecified displaced fracture of third cervical vertebra, sequela: Secondary | ICD-10-CM

## 2016-01-18 DIAGNOSIS — G540 Brachial plexus disorders: Secondary | ICD-10-CM

## 2016-01-18 MED ORDER — OXYCODONE HCL 15 MG PO TABS: ORAL_TABLET | ORAL | 0 refills | Status: DC

## 2016-01-18 MED ORDER — CLONAZEPAM 1 MG PO TABS: 1.0000 mg | ORAL_TABLET | Freq: Two times a day (BID) | ORAL | 3 refills | Status: DC

## 2016-01-18 NOTE — Progress Notes (Signed)
BP 127/89   Pulse (!) 104   Temp (!) 96.9 F (36.1 C) (Oral)    Subjective:    Patient ID: Keith Robertson, male    DOB: 07/29/1965, 50 y.o.   MRN: [id number]  HPI: Keith RubensteinJesse F Tapp is a 50 y.o. male presenting on 01/18/2016 for Follow-up  This patient comes in for recheck on his medications and chronic pain. He is still undergoing physical therapy through his neurosurgeon he has a few warts sessions scheduled. He states that he has noticed some improvement. We will continue chronic meds as noted. We are going work on lowering his Klonopin dosing we are going to reduce his monthly amount from 90-60 and plan to continue reducing this. We'll have him come back in 3 months.  Relevant past medical, surgical, family and social history reviewed and updated as indicated. Allergies and medications reviewed and updated.  Past Medical History:  Diagnosis Date  . Broken femur (HCC)   . Broken leg   . Broken neck (HCC)   . Hypertension   . Muscle, jerky movements (uncontrolled)     Past Surgical History:  Procedure Laterality Date  . ANKLE SURGERY Right   . BACK SURGERY    . KNEE SURGERY Left     Review of Systems  Constitutional: Positive for fatigue. Negative for appetite change.  HENT: Negative.   Eyes: Negative.  Negative for pain and visual disturbance.  Respiratory: Negative.  Negative for cough, chest tightness, shortness of breath and wheezing.   Cardiovascular: Negative.  Negative for chest pain, palpitations and leg swelling.  Gastrointestinal: Negative.  Negative for abdominal pain, diarrhea, nausea and vomiting.  Endocrine: Negative.   Genitourinary: Negative.   Musculoskeletal: Positive for arthralgias, back pain and myalgias.  Skin: Negative.  Negative for color change and rash.  Neurological: Negative.  Negative for weakness, numbness and headaches.  Psychiatric/Behavioral: Negative.       Medication List       Accurate as of 01/18/16  3:04 PM. Always use your most  recent med list.          cetirizine 10 MG tablet Commonly known as:  ZYRTEC Take 10 mg by mouth daily.   clonazePAM 1 MG tablet Commonly known as:  KLONOPIN Take 1 tablet (1 mg total) by mouth 2 (two) times daily.   gabapentin 800 MG tablet Commonly known as:  NEURONTIN Take 800 mg by mouth 3 (three) times daily.   hydrochlorothiazide 25 MG tablet Commonly known as:  HYDRODIURIL Take 25 mg by mouth daily.   meloxicam 15 MG tablet Commonly known as:  MOBIC Take 1 tablet (15 mg total) by mouth daily.   oxyCODONE 15 MG immediate release tablet Commonly known as:  ROXICODONE Take 1-2 tabs 4 times a day as needed.   oxyCODONE 15 MG immediate release tablet Commonly known as:  ROXICODONE Take 1-2 tablets 4 times a day as needed. Fill 30 days from original script date   oxyCODONE 15 MG immediate release tablet Commonly known as:  ROXICODONE Take 1-2 tablets 4 times a day as needed. Fill 60 days from original script date.   PROAIR HFA 108 (90 Base) MCG/ACT inhaler Generic drug:  albuterol USE 2 PUFFS ONCE A DAY   simvastatin 20 MG tablet Commonly known as:  ZOCOR Take 20 mg by mouth daily.   SYMBICORT 80-4.5 MCG/ACT inhaler Generic drug:  budesonide-formoterol Take 2 puffs by mouth 2 (two) times daily.   tiZANidine 4 MG tablet Commonly known  as:  ZANAFLEX   valACYclovir 500 MG tablet Commonly known as:  VALTREX Take 500 mg by mouth 2 (two) times daily.          Objective:    BP 127/89   Pulse (!) 104   Temp (!) 96.9 F (36.1 C) (Oral)   No Known Allergies  Physical Exam  Constitutional: He appears well-developed and well-nourished.  HENT:  Head: Normocephalic and atraumatic.  Eyes: Conjunctivae and EOM are normal. Pupils are equal, round, and reactive to light.  Neck: Normal range of motion. Neck supple.  Cardiovascular: Normal rate, regular rhythm and normal heart sounds.   Pulmonary/Chest: Effort normal and breath sounds normal.  Abdominal: Soft.  Bowel sounds are normal.  Musculoskeletal:       Cervical back: He exhibits decreased range of motion, swelling, edema and pain.       Lumbar back: He exhibits decreased range of motion, deformity and spasm.  Skin: Skin is warm and dry.  Nursing note and vitals reviewed.       Assessment & Plan:   1. Closed displaced fracture of third cervical vertebra, unspecified fracture morphology, sequela - oxyCODONE (ROXICODONE) 15 MG immediate release tablet; Take 1-2 tabs 4 times a day as needed.  Dispense: 240 tablet; Refill: 0 - oxyCODONE (ROXICODONE) 15 MG immediate release tablet; Take 1-2 tablets 4 times a day as needed. Fill 30 days from original script date  Dispense: 240 tablet; Refill: 0 - oxyCODONE (ROXICODONE) 15 MG immediate release tablet; Take 1-2 tablets 4 times a day as needed. Fill 60 days from original script date.  Dispense: 240 tablet; Refill: 0  2. Brachial plexopathy - oxyCODONE (ROXICODONE) 15 MG immediate release tablet; Take 1-2 tabs 4 times a day as needed.  Dispense: 240 tablet; Refill: 0 - oxyCODONE (ROXICODONE) 15 MG immediate release tablet; Take 1-2 tablets 4 times a day as needed. Fill 30 days from original script date  Dispense: 240 tablet; Refill: 0 - oxyCODONE (ROXICODONE) 15 MG immediate release tablet; Take 1-2 tablets 4 times a day as needed. Fill 60 days from original script date.  Dispense: 240 tablet; Refill: 0  3. Encounter for immunization - Flu Vaccine QUAD 36+ mos IM   Continue all other maintenance medications as listed above.  Follow up plan: Return in about 3 months (around 04/17/2016) for recheck.  Orders Placed This Encounter  Procedures  . Flu Vaccine QUAD 36+ mos IM    Educational handout given for back pain  Remus LofflerAngel S. Audrina Marten PA-C [facility] [address]  [place], [place] [id number] [phone]   01/18/2016, 3:04 PM

## 2016-01-18 NOTE — Patient Instructions (Signed)
Cervical Sprain A cervical sprain is a stretch or tear in the tissues that connect bones (ligaments) in the neck. Most neck (cervical) sprains get better in 4-6 weeks. Follow these instructions at home: If you have a neck collar:  Wear it as told by your doctor. Do not take off (do not remove) the collar unless your doctor says that this is safe.  Ask your doctor before adjusting your collar.  If you have long hair, keep it outside of the collar.  Ask your doctor if you may take off the collar for cleaning and bathing. If you may take off the collar:  Follow instructions from your doctor about how to take off the collar safely.  Clean the collar by wiping it with mild soap and water. Let it air-dry all the way.  If your collar has removable pads:  Take the pads out every 1-2 days.  Hand wash the pads with soap and water.  Let the pads air-dry all the way before you put them back in the collar. Do not dry them in a clothes dryer. Do not dry them with a hair dryer.  Check your skin under the collar for irritation or sores. If you see any, tell your doctor. Managing pain, stiffness, and swelling  Use a cervical traction device, if told by your doctor.  If told, put heat on the affected area. Do this before exercises (physical therapy) or as often as told by your doctor. Use the heat source that your doctor recommends, such as a moist heat pack or a heating pad.  Place a towel between your skin and the heat source.  Leave the heat on for 20-30 minutes.  Take the heat off (remove the heat) if your skin turns bright red. This is very important if you cannot feel pain, heat, or cold. You may have a greater risk of getting burned.  Put ice on the affected area.  Put ice in a plastic bag.  Place a towel between your skin and the bag.  Leave the ice on for 20 minutes, 2-3 times a day. Activity  Do not drive while wearing a neck collar. If you do not have a neck collar, ask your  doctor if it is safe to drive.  Do not drive or use heavy machinery while taking prescription pain medicine or muscle relaxants, unless your doctor approves.  Do not lift anything that is heavier than 10 lb (4.5 kg) until your doctor tells you that it is safe.  Rest as told by your doctor.  Avoid activities that make you feel worse. Ask your doctor what activities are safe for you.  Do exercises as told by your doctor or physical therapist. Preventing neck sprain  Practice good posture. Adjust your workstation to help with this, if needed.  Exercise regularly as told by your doctor or physical therapist.  Avoid activities that are risky or may cause a neck sprain (cervical sprain). General instructions  Take over-the-counter and prescription medicines only as told by your doctor.  Do not use any products that contain nicotine or tobacco. This includes cigarettes and e-cigarettes. If you need help quitting, ask your doctor.  Keep all follow-up visits as told by your doctor. This is important. Contact a doctor if:  You have pain or other symptoms that get worse.  You have symptoms that do not get better after 2 weeks.  You have pain that does not get better with medicine.  You start to have new,   unexplained symptoms.  You have sores or irritated skin from wearing your neck collar. Get help right away if:  You have very bad pain.  You have any of the following in any part of your body:  Loss of feeling (numbness).  Tingling.  Weakness.  You cannot move a part of your body (you have paralysis).  Your activity level does not improve. Summary  A cervical sprain is a stretch or tear in the tissues that connect bones (ligaments) in the neck.  If you have a neck (cervical) collar, do not take off the collar unless your doctor says that this is safe.  Put ice on affected areas as told by your doctor.  Put heat on affected areas as told by your doctor.  Good posture  and regular exercise can help prevent a neck sprain from happening again. This information is not intended to replace advice given to you by your health care provider. Make sure you discuss any questions you have with your health care provider. Document Released: 07/16/2007 Document Revised: 10/09/2015 Document Reviewed: 10/09/2015 Elsevier Interactive Patient Education  2017 Elsevier Inc.  

## 2016-01-21 DIAGNOSIS — M542 Cervicalgia: Secondary | ICD-10-CM | POA: Diagnosis not present

## 2016-01-24 DIAGNOSIS — M542 Cervicalgia: Secondary | ICD-10-CM | POA: Diagnosis not present

## 2016-03-11 ENCOUNTER — Other Ambulatory Visit: Payer: Self-pay | Admitting: Physician Assistant

## 2016-03-24 ENCOUNTER — Encounter: Payer: Self-pay | Admitting: Physician Assistant

## 2016-03-24 MED ORDER — GABAPENTIN 800 MG PO TABS: 800.0000 mg | ORAL_TABLET | Freq: Three times a day (TID) | ORAL | 5 refills | Status: DC

## 2016-04-07 ENCOUNTER — Other Ambulatory Visit: Payer: Self-pay | Admitting: *Deleted

## 2016-04-07 MED ORDER — HYDROCHLOROTHIAZIDE 25 MG PO TABS: 25.0000 mg | ORAL_TABLET | Freq: Every day | ORAL | 1 refills | Status: DC

## 2016-04-16 ENCOUNTER — Encounter: Payer: Self-pay | Admitting: Physician Assistant

## 2016-04-16 ENCOUNTER — Other Ambulatory Visit: Payer: Self-pay | Admitting: Physician Assistant

## 2016-04-16 ENCOUNTER — Ambulatory Visit (INDEPENDENT_AMBULATORY_CARE_PROVIDER_SITE_OTHER): Payer: Medicare Other | Admitting: Physician Assistant

## 2016-04-16 VITALS — BP 121/86 | HR 107 | Temp 97.4°F | Ht 70.0 in | Wt 230.0 lb

## 2016-04-16 DIAGNOSIS — G540 Brachial plexus disorders: Secondary | ICD-10-CM

## 2016-04-16 DIAGNOSIS — J309 Allergic rhinitis, unspecified: Secondary | ICD-10-CM | POA: Insufficient documentation

## 2016-04-16 DIAGNOSIS — S12200S Unspecified displaced fracture of third cervical vertebra, sequela: Secondary | ICD-10-CM

## 2016-04-16 DIAGNOSIS — M159 Polyosteoarthritis, unspecified: Secondary | ICD-10-CM | POA: Insufficient documentation

## 2016-04-16 DIAGNOSIS — M150 Primary generalized (osteo)arthritis: Secondary | ICD-10-CM | POA: Diagnosis not present

## 2016-04-16 DIAGNOSIS — S8412XA Injury of peroneal nerve at lower leg level, left leg, initial encounter: Secondary | ICD-10-CM

## 2016-04-16 DIAGNOSIS — Z8781 Personal history of (healed) traumatic fracture: Secondary | ICD-10-CM | POA: Insufficient documentation

## 2016-04-16 DIAGNOSIS — Z79891 Long term (current) use of opiate analgesic: Secondary | ICD-10-CM | POA: Diagnosis not present

## 2016-04-16 DIAGNOSIS — J4540 Moderate persistent asthma, uncomplicated: Secondary | ICD-10-CM | POA: Diagnosis not present

## 2016-04-16 DIAGNOSIS — E782 Mixed hyperlipidemia: Secondary | ICD-10-CM | POA: Diagnosis not present

## 2016-04-16 DIAGNOSIS — R52 Pain, unspecified: Secondary | ICD-10-CM

## 2016-04-16 MED ORDER — OXYCODONE HCL 15 MG PO TABS: ORAL_TABLET | ORAL | 0 refills | Status: DC

## 2016-04-16 MED ORDER — CETIRIZINE HCL 10 MG PO TABS: 10.0000 mg | ORAL_TABLET | Freq: Every day | ORAL | 3 refills | Status: DC

## 2016-04-16 MED ORDER — HYDROCHLOROTHIAZIDE 25 MG PO TABS: 25.0000 mg | ORAL_TABLET | Freq: Every day | ORAL | 3 refills | Status: DC

## 2016-04-16 MED ORDER — SIMVASTATIN 20 MG PO TABS
20.0000 mg | ORAL_TABLET | Freq: Every day | ORAL | 3 refills | Status: DC
Start: 2016-04-16 — End: 2017-06-12

## 2016-04-16 MED ORDER — TIZANIDINE HCL 4 MG PO TABS: 4.0000 mg | ORAL_TABLET | Freq: Three times a day (TID) | ORAL | 5 refills | Status: DC | PRN

## 2016-04-16 NOTE — Patient Instructions (Signed)
Back Pain, Adult  Back pain is very common. The pain often gets better over time. The cause of back pain is usually not dangerous. Most people can learn to manage their back pain on their own.  Follow these instructions at home:  Watch your back pain for any changes. The following actions may help to lessen any pain you are feeling:  · Stay active. Start with short walks on flat ground if you can. Try to walk farther each day.  · Exercise regularly as told by your doctor. Exercise helps your back heal faster. It also helps avoid future injury by keeping your muscles strong and flexible.  · Do not sit, drive, or stand in one place for more than 30 minutes.  · Do not stay in bed. Resting more than 1-2 days can slow down your recovery.  · Be careful when you bend or lift an object. Use good form when lifting:  ? Bend at your knees.  ? Keep the object close to your body.  ? Do not twist.  · Sleep on a firm mattress. Lie on your side, and bend your knees. If you lie on your back, put a pillow under your knees.  · Take medicines only as told by your doctor.  · Put ice on the injured area.  ? Put ice in a plastic bag.  ? Place a towel between your skin and the bag.  ? Leave the ice on for 20 minutes, 2-3 times a day for the first 2-3 days. After that, you can switch between ice and heat packs.  · Avoid feeling anxious or stressed. Find good ways to deal with stress, such as exercise.  · Maintain a healthy weight. Extra weight puts stress on your back.    Contact a doctor if:  · You have pain that does not go away with rest or medicine.  · You have worsening pain that goes down into your legs or buttocks.  · You have pain that does not get better in one week.  · You have pain at night.  · You lose weight.  · You have a fever or chills.  Get help right away if:  · You cannot control when you poop (bowel movement) or pee (urinate).  · Your arms or legs feel weak.  · Your arms or legs lose feeling (numbness).  · You feel sick  to your stomach (nauseous) or throw up (vomit).  · You have belly (abdominal) pain.  · You feel like you may pass out (faint).  This information is not intended to replace advice given to you by your health care provider. Make sure you discuss any questions you have with your health care provider.  Document Released: 07/16/2007 Document Revised: 07/05/2015 Document Reviewed: 05/31/2013  Elsevier Interactive Patient Education © 2017 Elsevier Inc.

## 2016-04-18 DIAGNOSIS — R52 Pain, unspecified: Secondary | ICD-10-CM | POA: Insufficient documentation

## 2016-04-18 DIAGNOSIS — S12200A Unspecified displaced fracture of third cervical vertebra, initial encounter for closed fracture: Secondary | ICD-10-CM | POA: Insufficient documentation

## 2016-04-18 NOTE — Progress Notes (Signed)
BP 121/86   Pulse (!) 107   Temp 97.4 F (36.3 C) (Oral)   Ht 5\' 10"  (1.778 m)   Wt 230 lb (104.3 kg)   BMI 33.00 kg/m    Subjective:    Patient ID: Keith Robertson, male    DOB: 08-16-1965, 51 y.o.   MRN: [id number]  HPI: Keith Robertson is a 51 y.o. male presenting on 04/16/2016 for Follow-up and Medication Refill This patient comes in for periodic recheck on medications and conditions including chronic pain, asthma, htn, hyperlipidemia.  Pain assessment: Cause of pain- peroneal nerve damage, DDD, vertebral fractures Pain location- neck, lumbar, left leg Pain on scale of 1-10- 7-8 Frequency- constant What increases pain-lifting, long walking or standing What makes pain Better-rest, meds help significantly Effects on ADL - moderate Any change in general medical condition-no  Current medications- tizanidine Oxycodone 60-80 mg daily gabaoentin 800 TID mobic 15mg  daily   Effectiveness of current meds-good Adverse reactions form pain meds-none  Pill count performed-No Urine drug screen- Yes Was the [facility] reviewed- yes  If yes were their any concerning findings? - no  All medications are reviewed today. There are no reports of any problems with the medications. All of the medical conditions are reviewed and updated.  Lab work is reviewed and will be ordered as medically necessary. There are no new problems reported with today's visit.    Relevant past medical, surgical, family and social history reviewed and updated as indicated. Allergies and medications reviewed and updated.  Past Medical History:  Diagnosis Date  . Broken femur (HCC)   . Broken leg   . Broken neck (HCC)   . Hypertension   . Muscle, jerky movements (uncontrolled)     Past Surgical History:  Procedure Laterality Date  . ANKLE SURGERY Right   . BACK SURGERY    . KNEE SURGERY Left     Review of Systems  Constitutional: Negative for appetite change and fatigue.  HENT: Negative.   Eyes: Negative.   Negative for pain and visual disturbance.  Respiratory: Negative.  Negative for cough, chest tightness, shortness of breath and wheezing.   Cardiovascular: Negative.  Negative for chest pain, palpitations and leg swelling.  Gastrointestinal: Negative.  Negative for abdominal pain, diarrhea, nausea and vomiting.  Endocrine: Negative.   Genitourinary: Negative.   Musculoskeletal: Positive for arthralgias, back pain, gait problem, joint swelling, myalgias, neck pain and neck stiffness.  Skin: Negative.  Negative for color change and rash.  Neurological: Positive for weakness. Negative for numbness and headaches.  Psychiatric/Behavioral: Negative.     Allergies as of 04/16/2016   No Known Allergies     Medication List       Accurate as of 04/16/16 11:59 PM. Always use your most recent med list.          cetirizine 10 MG tablet Commonly known as:  ZYRTEC Take 1 tablet (10 mg total) by mouth daily.   clonazePAM 1 MG tablet Commonly known as:  KLONOPIN Take 1 tablet (1 mg total) by mouth 2 (two) times daily.   gabapentin 800 MG tablet Commonly known as:  NEURONTIN Take 1 tablet (800 mg total) by mouth 3 (three) times daily.   hydrochlorothiazide 25 MG tablet Commonly known as:  HYDRODIURIL Take 1 tablet (25 mg total) by mouth daily.   meloxicam 15 MG tablet Commonly known as:  MOBIC Take 1 tablet (15 mg total) by mouth daily.   oxyCODONE 15 MG immediate release tablet Commonly  known as:  ROXICODONE Take 1-2 tabs 4 times a day as needed.   oxyCODONE 15 MG immediate release tablet Commonly known as:  ROXICODONE Take 1-2 tablets 4 times a day as needed. Fill 30 days from original script date   oxyCODONE 15 MG immediate release tablet Commonly known as:  ROXICODONE Take 1-2 tablets 4 times a day as needed. Fill 60 days from original script date.   PROAIR HFA 108 (90 Base) MCG/ACT inhaler Generic drug:  albuterol USE 2 PUFFS ONCE A DAY   simvastatin 20 MG tablet Commonly  known as:  ZOCOR Take 1 tablet (20 mg total) by mouth daily.   SYMBICORT 80-4.5 MCG/ACT inhaler Generic drug:  budesonide-formoterol Take 2 puffs by mouth 2 (two) times daily.   tiZANidine 4 MG tablet Commonly known as:  ZANAFLEX Take 1 tablet (4 mg total) by mouth every 8 (eight) hours as needed.   valACYclovir 500 MG tablet Commonly known as:  VALTREX Take 500 mg by mouth 2 (two) times daily.          Objective:    BP 121/86   Pulse (!) 107   Temp 97.4 F (36.3 C) (Oral)   Ht 5\' 10"  (1.778 m)   Wt 230 lb (104.3 kg)   BMI 33.00 kg/m   No Known Allergies  Physical Exam  Constitutional: He appears well-developed and well-nourished. No distress.  HENT:  Head: Normocephalic and atraumatic.  Eyes: Conjunctivae and EOM are normal. Pupils are equal, round, and reactive to light.  Cardiovascular: Normal rate, regular rhythm and normal heart sounds.   Pulmonary/Chest: Effort normal and breath sounds normal. No respiratory distress.  Musculoskeletal:       Cervical back: He exhibits decreased range of motion, tenderness and deformity.       Lumbar back: He exhibits decreased range of motion, tenderness, pain and spasm.       Left lower leg: He exhibits tenderness. He exhibits no swelling and no edema.       Legs: Neurological:  Reflex Scores:      Patellar reflexes are 1+ on the right side and 3+ on the left side. Skin: Skin is warm and dry.  Psychiatric: He has a normal mood and affect. His behavior is normal.  Nursing note and vitals reviewed.   No results found for this or any previous visit.    Assessment & Plan:   1. History of vertebral fracture  2. Injury of left peroneal nerve, initial encounter - oxyCODONE (ROXICODONE) 15 MG immediate release tablet; Take 1-2 tabs 4 times a day as needed.  Dispense: 240 tablet; Refill: 0 - oxyCODONE (ROXICODONE) 15 MG immediate release tablet; Take 1-2 tablets 4 times a day as needed. Fill 30 days from original script date   Dispense: 240 tablet; Refill: 0 - oxyCODONE (ROXICODONE) 15 MG immediate release tablet; Take 1-2 tablets 4 times a day as needed. Fill 60 days from original script date.  Dispense: 240 tablet; Refill: 0 - tiZANidine (ZANAFLEX) 4 MG tablet; Take 1 tablet (4 mg total) by mouth every 8 (eight) hours as needed.  Dispense: 90 tablet; Refill: 5  3. Brachial plexopathy - oxyCODONE (ROXICODONE) 15 MG immediate release tablet; Take 1-2 tabs 4 times a day as needed.  Dispense: 240 tablet; Refill: 0 - oxyCODONE (ROXICODONE) 15 MG immediate release tablet; Take 1-2 tablets 4 times a day as needed. Fill 30 days from original script date  Dispense: 240 tablet; Refill: 0 - oxyCODONE (ROXICODONE) 15 MG immediate  release tablet; Take 1-2 tablets 4 times a day as needed. Fill 60 days from original script date.  Dispense: 240 tablet; Refill: 0 - tiZANidine (ZANAFLEX) 4 MG tablet; Take 1 tablet (4 mg total) by mouth every 8 (eight) hours as needed.  Dispense: 90 tablet; Refill: 5  4. Moderate persistent asthma without complication  5. Mixed hyperlipidemia - simvastatin (ZOCOR) 20 MG tablet; Take 1 tablet (20 mg total) by mouth daily.  Dispense: 90 tablet; Refill: 3  6. Chronic allergic rhinitis, unspecified seasonality, unspecified trigger - cetirizine (ZYRTEC) 10 MG tablet; Take 1 tablet (10 mg total) by mouth daily.  Dispense: 90 tablet; Refill: 3  7. Primary osteoarthritis involving multiple joints - oxyCODONE (ROXICODONE) 15 MG immediate release tablet; Take 1-2 tabs 4 times a day as needed.  Dispense: 240 tablet; Refill: 0 - oxyCODONE (ROXICODONE) 15 MG immediate release tablet; Take 1-2 tablets 4 times a day as needed. Fill 30 days from original script date  Dispense: 240 tablet; Refill: 0 - oxyCODONE (ROXICODONE) 15 MG immediate release tablet; Take 1-2 tablets 4 times a day as needed. Fill 60 days from original script date.  Dispense: 240 tablet; Refill: 0  8. Closed displaced fracture of third cervical  vertebra, unspecified fracture morphology, sequela - oxyCODONE (ROXICODONE) 15 MG immediate release tablet; Take 1-2 tabs 4 times a day as needed.  Dispense: 240 tablet; Refill: 0 - oxyCODONE (ROXICODONE) 15 MG immediate release tablet; Take 1-2 tablets 4 times a day as needed. Fill 30 days from original script date  Dispense: 240 tablet; Refill: 0 - oxyCODONE (ROXICODONE) 15 MG immediate release tablet; Take 1-2 tablets 4 times a day as needed. Fill 60 days from original script date.  Dispense: 240 tablet; Refill: 0  9. Pain management - ToxASSURE Select 13 (MW), Urine   Continue all other maintenance medications as listed above.  Follow up plan: Return in about 3 months (around 07/17/2016) for recheck.  Educational handout given for back pain  Remus Loffler PA-C [facility] [address]  [place], [place] [address] [phone]   04/18/2016, 11:02 AM

## 2016-04-21 LAB — TOXASSURE SELECT 13 (MW), URINE

## 2016-04-24 ENCOUNTER — Other Ambulatory Visit (INDEPENDENT_AMBULATORY_CARE_PROVIDER_SITE_OTHER): Payer: Medicare Other

## 2016-04-24 DIAGNOSIS — E782 Mixed hyperlipidemia: Secondary | ICD-10-CM

## 2016-04-24 LAB — CMP14+EGFR
A/G RATIO: 1.9 (ref 1.2–2.2)
ALBUMIN: 4 g/dL (ref 3.5–5.5)
ALK PHOS: 63 IU/L (ref 39–117)
ALT: 46 IU/L — ABNORMAL HIGH (ref 0–44)
AST: 39 IU/L (ref 0–40)
BILIRUBIN TOTAL: 0.4 mg/dL (ref 0.0–1.2)
BUN / CREAT RATIO: 13 (ref 9–20)
BUN: 11 mg/dL (ref 6–24)
CHLORIDE: 101 mmol/L (ref 96–106)
CO2: 24 mmol/L (ref 18–29)
Calcium: 9.3 mg/dL (ref 8.7–10.2)
Creatinine, Ser: 0.86 mg/dL (ref 0.76–1.27)
GFR calc non Af Amer: 102 mL/min/{1.73_m2} (ref >59)
GFR, EST AFRICAN AMERICAN: 118 mL/min/{1.73_m2} (ref >59)
GLOBULIN, TOTAL: 2.1 g/dL (ref 1.5–4.5)
GLUCOSE: 88 mg/dL (ref 65–99)
Potassium: 4.2 mmol/L (ref 3.5–5.2)
SODIUM: 141 mmol/L (ref 134–144)
Total Protein: 6.1 g/dL (ref 6.0–8.5)

## 2016-04-24 LAB — CBC WITH DIFFERENTIAL/PLATELET
BASOS: 1 %
Basophils Absolute: 0 10*3/uL (ref 0.0–0.2)
EOS (ABSOLUTE): 0.4 10*3/uL (ref 0.0–0.4)
EOS: 6 %
HEMATOCRIT: 37.6 % (ref 37.5–51.0)
Hemoglobin: 12.7 g/dL — ABNORMAL LOW (ref 13.0–17.7)
IMMATURE GRANS (ABS): 0 10*3/uL (ref 0.0–0.1)
IMMATURE GRANULOCYTES: 0 %
LYMPHS: 33 %
Lymphocytes Absolute: 2 10*3/uL (ref 0.7–3.1)
MCH: 31.5 pg (ref 26.6–33.0)
MCHC: 33.8 g/dL (ref 31.5–35.7)
MCV: 93 fL (ref 79–97)
Monocytes Absolute: 0.7 10*3/uL (ref 0.1–0.9)
Monocytes: 12 %
NEUTROS PCT: 48 %
Neutrophils Absolute: 3 10*3/uL (ref 1.4–7.0)
PLATELETS: 326 10*3/uL (ref 150–379)
RBC: 4.03 x10E6/uL — AB (ref 4.14–5.80)
RDW: 13.8 % (ref 12.3–15.4)
WBC: 6.2 10*3/uL (ref 3.4–10.8)

## 2016-04-24 LAB — LIPID PANEL
CHOLESTEROL TOTAL: 132 mg/dL (ref 100–199)
Chol/HDL Ratio: 2.3 ratio units (ref 0.0–5.0)
HDL: 58 mg/dL (ref >39)
LDL Calculated: 59 mg/dL (ref 0–99)
Triglycerides: 76 mg/dL (ref 0–149)
VLDL Cholesterol Cal: 15 mg/dL (ref 5–40)

## 2016-07-16 ENCOUNTER — Encounter: Payer: Self-pay | Admitting: Physician Assistant

## 2016-07-16 ENCOUNTER — Ambulatory Visit (INDEPENDENT_AMBULATORY_CARE_PROVIDER_SITE_OTHER): Payer: Medicare Other | Admitting: Physician Assistant

## 2016-07-16 DIAGNOSIS — M159 Polyosteoarthritis, unspecified: Secondary | ICD-10-CM

## 2016-07-16 DIAGNOSIS — M150 Primary generalized (osteo)arthritis: Secondary | ICD-10-CM | POA: Diagnosis not present

## 2016-07-16 DIAGNOSIS — S8412XA Injury of peroneal nerve at lower leg level, left leg, initial encounter: Secondary | ICD-10-CM

## 2016-07-16 DIAGNOSIS — S12200S Unspecified displaced fracture of third cervical vertebra, sequela: Secondary | ICD-10-CM | POA: Diagnosis not present

## 2016-07-16 DIAGNOSIS — G540 Brachial plexus disorders: Secondary | ICD-10-CM

## 2016-07-16 MED ORDER — OXYCODONE HCL 15 MG PO TABS: ORAL_TABLET | ORAL | 0 refills | Status: DC

## 2016-07-16 MED ORDER — ACETAMINOPHEN-CODEINE #3 300-30 MG PO TABS: 2.0000 | ORAL_TABLET | Freq: Three times a day (TID) | ORAL | 0 refills | Status: DC | PRN

## 2016-07-16 MED ORDER — TRIAMCINOLONE ACETONIDE 55 MCG/ACT NA AERO: 2.0000 | INHALATION_SPRAY | Freq: Every day | NASAL | 12 refills | Status: AC

## 2016-07-16 NOTE — Patient Instructions (Signed)
Rotator Cuff Tear Rehab After Surgery Ask your health care provider which exercises are safe for you. Do exercises exactly as told by your health care provider and adjust them as directed. It is normal to feel mild stretching, pulling, tightness, or discomfort as you do these exercises, but you should stop right away if you feel sudden pain or your pain gets worse. Do not begin these exercises until told by your health care provider. Stretching and range of motion exercises These exercises warm up your muscles and joints and improve the movement and flexibility of your shoulder. These exercises also help to relieve pain, numbness, and tingling. Exercise A: Pendulum  1. Stand near a wall or a surface that you can hold onto for balance. 2. Bend at the waist and let your left / right arm hang straight down. Use your other arm to keep your balance. 3. Relax your arm and shoulder muscles, and move your hips and your trunk so your left / right arm swings freely. Your arm should swing because of the motion of your body, not because you are using your arm or shoulder muscles. 4. Keep moving so your arm swings in the following directions, as told by your health care provider: ? Side to side. ? Forward and backward. ? In clockwise and counterclockwise circles. Repeat __________ times, or for __________ seconds per direction. Complete this exercise __________ times a day. Exercise B: Flexion, seated  1. Sit in a stable chair so your left / right forearm can rest on a flat surface. Your elbow should rest at a height that keeps your upper arm next to your body. 2. Keeping your shoulder relaxed, lean forward at the waist and let your hand slide forward. Stop when you feel a stretch in your shoulder, or when you reach the angle that is recommended by your health care provider. 3. Hold for __________ seconds. 4. Slowly return to the starting position. Repeat __________ times. Complete this exercise __________  times a day. Exercise C: Flexion, standing  1. Stand and hold a broomstick, a cane, or a similar object. Place your hands a little more than shoulder-width apart on the object. Your left / right hand should be palm-up, and your other hand should be palm-down. 2. Push the stick down with your healthy arm to raise your left / right arm in front of your body, and then over your head. Use your other hand to help move the stick. Stop when you feel a stretch in your shoulder, or when you reach the angle that is recommended by your health care provider. ? Avoid shrugging your shoulder while you raise your arm. Keep your shoulder blade tucked down toward your spine. ? Keep your left / right shoulder muscles relaxed. 3. Hold for __________ seconds. 4. Slowly return to the starting position. Repeat __________ times. Complete this exercise __________ times a day. Exercise D: Abduction, supine  1. Lie on your back and hold a broomstick, a cane, or a similar object. Place your hands a little more than shoulder-width apart on the object. Your left / right hand should be palm-up, and your other hand should be palm-down. 2. Push the stick to raise your left / right arm out to your side and then over your head. Use your other hand to help move the stick. Stop when you feel a stretch in your shoulder, or when you reach the angle that is recommended by your health care provider. ? Avoid shrugging your shoulder while   you raise your arm. Keep your shoulder blade tucked down toward your spine. 3. Hold for __________ seconds. 4. Slowly return to the starting position. Repeat __________ times. Complete this exercise __________ times a day. Exercise E: Shoulder flexion, active-assisted  1. Lie on your back. You may bend your knees for comfort. 2. Hold a broomstick, a cane, or a similar object so your hands are about shoulder-width apart. Your palms should face toward your feet. 3. Raise your left / right arm over your  head and behind your head, toward the floor. Use your other hand to help you do this. Stop when you feel a gentle stretch in your shoulder, or when you reach the angle that is recommended by your health care provider. 4. Hold for __________ seconds. 5. Use the broomstick and your other arm to help you return your left / right arm to the starting position. Repeat __________ times. Complete this exercise __________ times a day. Exercise F: External rotation  1. Sit in a stable chair without armrests, or stand. 2. Tuck a soft object, such as a folded towel or a small ball, under your left / right upper arm. 3. Hold a broomstick, a cane, or a similar object so your palms face down, toward the floor. Bend your elbows to an "L" shape (90 degrees), and keep your hands about shoulder-width apart. 4. Straighten your healthy arm and push the broomstick across your body, toward your left / right side. Keep your left / right arm bent. This will rotate your left / right forearm away from your body. 5. Hold for __________ seconds. 6. Slowly return to the starting position. Repeat __________ times. Complete this exercise __________ times a day. Strengthening exercises These exercises build strength and endurance in your shoulder. Endurance is the ability to use your muscles for a long time, even after they get tired. Exercise G: Shoulder flexion, isometric  1. Stand or sit about 4-6 inches (10-15 cm) away from a wall with your left / right side facing the wall. 2. Gently make a fist and place your left / right hand on the wall so the top of your fist touches the wall. 3. With your left / right elbow straight, gently press the top of your fist into the wall. Gradually increase the pressure until you are pressing as hard as you can without shrugging your shoulder. 4. Hold for __________ seconds. 5. Slowly release the tension and relax your muscles completely before you repeat the exercise. Repeat __________  times. Complete this exercise __________ times a day. Exercise H: Shoulder abduction, isometric  1. Stand or sit about 4-6 inches (10-15 cm) away from a wall with your right/left side facing the wall. 2. Bend your left / right elbow and gently press your elbow into the wall as if you are trying to move your arm out to your side. Increase the pressure gradually until you are pressing as hard as you can without shrugging your shoulder. 3. Hold for __________ seconds. 4. Slowly release the tension and relax your muscles completely before repeating the exercise. Repeat __________ times. Complete this exercise __________ times a day. Exercise I: Internal rotation, isometric  1. Stand or sit in a doorway, facing the door frame. 2. Bend your left / right elbow and place the palm of your hand against the door frame. Only your palm should be touching the frame. Keep your upper arm at your side. 3. Gently press your hand into the door frame, as if   you are trying to push your arm toward your abdomen. Do not let your wrist bend. ? Avoid shrugging your shoulder while you press your hand into the door frame. Keep your shoulder blade tucked down toward the middle of your back. 4. Hold for __________ seconds. 5. Slowly release the tension, and relax your muscles completely before you repeat the exercise. Repeat __________ times. Complete this exercise __________ times a day. Exercise J: External rotation, isometric  1. Stand or sit in a doorway, facing the door frame. 2. Bend your left / right elbow and place the back of your wrist against the door frame. Only the back of your wrist should be touching the frame. Keep your upper arm at your side. 3. Gently press your wrist against the door frame, as if you are trying to push your arm away from your abdomen. ? Avoid shrugging your shoulder while you press your wrist into the door frame. Keep your shoulder blade tucked down toward the middle of your  back. 4. Hold for __________ seconds. 5. Slowly release the tension, and relax your muscles completely before you repeat the exercise. Repeat __________ times. Complete this exercise __________ times a day. This information is not intended to replace advice given to you by your health care provider. Make sure you discuss any questions you have with your health care provider. Document Released: 01/27/2005 Document Revised: 10/04/2015 Document Reviewed: 02/10/2015 Elsevier Interactive Patient Education  2018 [employer]  

## 2016-07-21 NOTE — Progress Notes (Signed)
BP 125/82   Pulse (!) 123   Temp 97.3 F (36.3 C) (Oral)   Ht 5\' 10"  (1.778 m)   Wt 226 lb 4.8 oz (102.6 kg)   BMI 32.47 kg/m    Subjective:    Patient ID: Keith Robertson, male    DOB: [birth year], 51 y.o.   MRN: [id number]  HPI: Keith Robertson is a 51 y.o. male presenting on 07/16/2016 for Follow-up (3 month )    Relevant past medical, surgical, family and social history reviewed and updated as indicated. Allergies and medications reviewed and updated.  Past Medical History:  Diagnosis Date  . Broken femur (HCC)   . Broken leg   . Broken neck (HCC)   . Hypertension   . Muscle, jerky movements (uncontrolled)     Past Surgical History:  Procedure Laterality Date  . ANKLE SURGERY Right   . BACK SURGERY    . KNEE SURGERY Left     Review of Systems  Constitutional: Negative.  Negative for appetite change and fatigue.  Eyes: Negative for pain and visual disturbance.  Respiratory: Negative.  Negative for cough, chest tightness, shortness of breath and wheezing.   Cardiovascular: Negative.  Negative for chest pain, palpitations and leg swelling.  Gastrointestinal: Negative.  Negative for abdominal pain, diarrhea, nausea and vomiting.  Genitourinary: Negative.   Musculoskeletal: Positive for arthralgias, joint swelling, myalgias, neck pain and neck stiffness.  Skin: Negative.  Negative for color change and rash.  Neurological: Negative.  Negative for weakness, numbness and headaches.  Psychiatric/Behavioral: Negative.     Allergies as of 07/16/2016   No Known Allergies     Medication List       Accurate as of 07/16/16 11:59 PM. Always use your most recent med list.          acetaminophen-codeine 300-30 MG tablet Commonly known as:  TYLENOL #3 Take 2 tablets by mouth every 8 (eight) hours as needed for moderate pain (HEADACHE).   cetirizine 10 MG tablet Commonly known as:  ZYRTEC Take 1 tablet (10 mg total) by mouth daily.   gabapentin 800 MG tablet Commonly  known as:  NEURONTIN Take 1 tablet (800 mg total) by mouth 3 (three) times daily.   hydrochlorothiazide 25 MG tablet Commonly known as:  HYDRODIURIL Take 1 tablet (25 mg total) by mouth daily.   meloxicam 15 MG tablet Commonly known as:  MOBIC Take 1 tablet (15 mg total) by mouth daily.   oxyCODONE 15 MG immediate release tablet Commonly known as:  ROXICODONE Take 1-2 tabs 4 times a day as needed.   oxyCODONE 15 MG immediate release tablet Commonly known as:  ROXICODONE Take 1-2 tablets 4 times a day as needed. Fill 30 days from original script date   oxyCODONE 15 MG immediate release tablet Commonly known as:  ROXICODONE Take 1-2 tablets 4 times a day as needed. Fill 60 days from original script date.   PROAIR HFA 108 (90 Base) MCG/ACT inhaler Generic drug:  albuterol USE 2 PUFFS ONCE A DAY   simvastatin 20 MG tablet Commonly known as:  ZOCOR Take 1 tablet (20 mg total) by mouth daily.   SYMBICORT 80-4.5 MCG/ACT inhaler Generic drug:  budesonide-formoterol Take 2 puffs by mouth 2 (two) times daily.   tiZANidine 4 MG tablet Commonly known as:  ZANAFLEX Take 1 tablet (4 mg total) by mouth every 8 (eight) hours as needed.   triamcinolone 55 MCG/ACT Aero nasal inhaler Commonly known as:  NASACORT Place  2 sprays into the nose daily.   valACYclovir 500 MG tablet Commonly known as:  VALTREX Take 500 mg by mouth 2 (two) times daily.          Objective:    BP 125/82   Pulse (!) 123   Temp 97.3 F (36.3 C) (Oral)   Ht 5\' 10"  (1.778 m)   Wt 226 lb 4.8 oz (102.6 kg)   BMI 32.47 kg/m   No Known Allergies  Physical Exam  Constitutional: He appears well-developed and well-nourished. No distress.  HENT:  Head: Normocephalic and atraumatic.  Eyes: Conjunctivae and EOM are normal. Pupils are equal, round, and reactive to light.  Cardiovascular: Normal rate, regular rhythm and normal heart sounds.   Pulmonary/Chest: Effort normal and breath sounds normal. No  respiratory distress.  Musculoskeletal:       Right shoulder: He exhibits decreased range of motion, crepitus and pain. He exhibits no swelling.       Lumbar back: He exhibits decreased range of motion, tenderness, pain and spasm.  Neurological:  Reflex Scores:      Patellar reflexes are 1+ on the right side and 3+ on the left side. Skin: Skin is warm and dry.  Psychiatric: He has a normal mood and affect. His behavior is normal.  Nursing note and vitals reviewed.       Assessment & Plan:   1. Closed displaced fracture of third cervical vertebra, unspecified fracture morphology, sequela - oxyCODONE (ROXICODONE) 15 MG immediate release tablet; Take 1-2 tabs 4 times a day as needed.  Dispense: 240 tablet; Refill: 0 - oxyCODONE (ROXICODONE) 15 MG immediate release tablet; Take 1-2 tablets 4 times a day as needed. Fill 30 days from original script date  Dispense: 240 tablet; Refill: 0 - oxyCODONE (ROXICODONE) 15 MG immediate release tablet; Take 1-2 tablets 4 times a day as needed. Fill 60 days from original script date.  Dispense: 240 tablet; Refill: 0  2. Brachial plexopathy - oxyCODONE (ROXICODONE) 15 MG immediate release tablet; Take 1-2 tabs 4 times a day as needed.  Dispense: 240 tablet; Refill: 0 - oxyCODONE (ROXICODONE) 15 MG immediate release tablet; Take 1-2 tablets 4 times a day as needed. Fill 30 days from original script date  Dispense: 240 tablet; Refill: 0 - oxyCODONE (ROXICODONE) 15 MG immediate release tablet; Take 1-2 tablets 4 times a day as needed. Fill 60 days from original script date.  Dispense: 240 tablet; Refill: 0  3. Injury of left peroneal nerve, initial encounter - oxyCODONE (ROXICODONE) 15 MG immediate release tablet; Take 1-2 tabs 4 times a day as needed.  Dispense: 240 tablet; Refill: 0 - oxyCODONE (ROXICODONE) 15 MG immediate release tablet; Take 1-2 tablets 4 times a day as needed. Fill 30 days from original script date  Dispense: 240 tablet; Refill: 0 -  oxyCODONE (ROXICODONE) 15 MG immediate release tablet; Take 1-2 tablets 4 times a day as needed. Fill 60 days from original script date.  Dispense: 240 tablet; Refill: 0  4. Primary osteoarthritis involving multiple joints - oxyCODONE (ROXICODONE) 15 MG immediate release tablet; Take 1-2 tabs 4 times a day as needed.  Dispense: 240 tablet; Refill: 0 - oxyCODONE (ROXICODONE) 15 MG immediate release tablet; Take 1-2 tablets 4 times a day as needed. Fill 30 days from original script date  Dispense: 240 tablet; Refill: 0 - oxyCODONE (ROXICODONE) 15 MG immediate release tablet; Take 1-2 tablets 4 times a day as needed. Fill 60 days from original script date.  Dispense: 240  tablet; Refill: 0   Continue all other maintenance medications as listed above.  Follow up plan: Return in about 3 months (around 10/16/2016) for recheck.  Educational handout given for rotator cuff  Remus Loffler PA-C [facility] [address]  [place], [place] [address] [phone]   07/21/2016, 8:11 AM

## 2016-07-24 ENCOUNTER — Other Ambulatory Visit: Payer: Self-pay | Admitting: Physician Assistant

## 2016-09-21 ENCOUNTER — Other Ambulatory Visit: Payer: Self-pay | Admitting: Physician Assistant

## 2016-09-26 ENCOUNTER — Other Ambulatory Visit: Payer: Self-pay | Admitting: Physician Assistant

## 2016-10-17 ENCOUNTER — Encounter: Payer: Self-pay | Admitting: Physician Assistant

## 2016-10-17 ENCOUNTER — Ambulatory Visit (INDEPENDENT_AMBULATORY_CARE_PROVIDER_SITE_OTHER): Payer: Medicare Other | Admitting: Physician Assistant

## 2016-10-17 DIAGNOSIS — G540 Brachial plexus disorders: Secondary | ICD-10-CM

## 2016-10-17 DIAGNOSIS — S8412XA Injury of peroneal nerve at lower leg level, left leg, initial encounter: Secondary | ICD-10-CM | POA: Diagnosis not present

## 2016-10-17 DIAGNOSIS — M150 Primary generalized (osteo)arthritis: Secondary | ICD-10-CM

## 2016-10-17 DIAGNOSIS — M159 Polyosteoarthritis, unspecified: Secondary | ICD-10-CM

## 2016-10-17 DIAGNOSIS — S12200S Unspecified displaced fracture of third cervical vertebra, sequela: Secondary | ICD-10-CM | POA: Diagnosis not present

## 2016-10-17 MED ORDER — VALACYCLOVIR HCL 500 MG PO TABS: 500.0000 mg | ORAL_TABLET | Freq: Two times a day (BID) | ORAL | 11 refills | Status: DC

## 2016-10-17 MED ORDER — OXYCODONE HCL 15 MG PO TABS: ORAL_TABLET | ORAL | 0 refills | Status: DC

## 2016-10-17 MED ORDER — TIZANIDINE HCL 4 MG PO TABS: 4.0000 mg | ORAL_TABLET | Freq: Three times a day (TID) | ORAL | 5 refills | Status: DC | PRN

## 2016-10-17 NOTE — Patient Instructions (Signed)
In a few days you may receive a survey in the mail or online from [employer] regarding your visit with us today. Please take a moment to fill this out. Your feedback is very important to our whole office. It can help us better understand your needs as well as improve your experience and satisfaction. Thank you for taking your time to complete it. We care about you.  Brittiany Wiehe, PA-C  

## 2016-10-20 NOTE — Progress Notes (Signed)
BP 126/90   Pulse 89   Temp (!) 97 F (36.1 C) (Oral)   Ht  (1.778 m)   Wt 229 lb 9.6 oz (104.1 kg)   BMI 32.94 kg/m    Subjective:    Patient ID: Keith Robertson, male    DOB: Jun 03, 1965, 51 y.o.   MRN: [id number]  HPI: Keith Robertson is a 51 y.o. male presenting on 10/17/2016 for pain management (medication refill)  Chronic pain is under control, patient has no new complaints. Medications are keeping things stable. Needs refills for the next three months.  [place] Controlled Substance website checked and normal. Drug screen normal this year.  Relevant past medical, surgical, family and social history reviewed and updated as indicated. Allergies and medications reviewed and updated.  Past Medical History:  Diagnosis Date  . Broken femur (HCC)   . Broken leg   . Broken neck (HCC)   . Hypertension   . Muscle, jerky movements (uncontrolled)     Past Surgical History:  Procedure Laterality Date  . ANKLE SURGERY Right   . BACK SURGERY    . KNEE SURGERY Left     Review of Systems  Constitutional: Negative.  Negative for appetite change and fatigue.  HENT: Negative.   Eyes: Negative.  Negative for pain and visual disturbance.  Respiratory: Negative.  Negative for cough, chest tightness, shortness of breath and wheezing.   Cardiovascular: Negative.  Negative for chest pain, palpitations and leg swelling.  Gastrointestinal: Negative.  Negative for abdominal pain, diarrhea, nausea and vomiting.  Endocrine: Negative.   Genitourinary: Negative.   Musculoskeletal: Negative.   Skin: Negative.  Negative for color change and rash.  Neurological: Negative.  Negative for weakness, numbness and headaches.  Psychiatric/Behavioral: Negative.     Allergies as of 10/17/2016   No Known Allergies     Medication List       Accurate as of 10/17/16 11:59 PM. Always use your most recent med list.          acetaminophen-codeine 300-30 MG tablet Commonly known as:  TYLENOL #3 Take 2  tablets by mouth every 8 (eight) hours as needed for moderate pain (HEADACHE).   cetirizine 10 MG tablet Commonly known as:  ZYRTEC Take 1 tablet (10 mg total) by mouth daily.   gabapentin 800 MG tablet Commonly known as:  NEURONTIN TAKE 1 TABLET (800 MG TOTAL) BY MOUTH 3 (THREE) TIMES DAILY.   hydrochlorothiazide 25 MG tablet Commonly known as:  HYDRODIURIL Take 1 tablet (25 mg total) by mouth daily.   meloxicam 15 MG tablet Commonly known as:  MOBIC Take 1 tablet (15 mg total) by mouth daily.   oxyCODONE 15 MG immediate release tablet Commonly known as:  ROXICODONE Take 1-2 tabs 4 times a day as needed.   oxyCODONE 15 MG immediate release tablet Commonly known as:  ROXICODONE Take 1-2 tablets 4 times a day as needed. Fill 30 days from original script date   oxyCODONE 15 MG immediate release tablet Commonly known as:  ROXICODONE Take 1-2 tablets 4 times a day as needed. Fill 60 days from original script date.   PROAIR HFA 108 (90 Base) MCG/ACT inhaler Generic drug:  albuterol USE 2 PUFFS ONCE A DAY   simvastatin 20 MG tablet Commonly known as:  ZOCOR Take 1 tablet (20 mg total) by mouth daily.   SYMBICORT 80-4.5 MCG/ACT inhaler Generic drug:  budesonide-formoterol USE 2 PUFFS TWICE A DAY 30 DAYS INHALATION 30 DAYS  tiZANidine 4 MG tablet Commonly known as:  ZANAFLEX Take 1 tablet (4 mg total) by mouth every 8 (eight) hours as needed.   triamcinolone 55 MCG/ACT Aero nasal inhaler Commonly known as:  NASACORT Place 2 sprays into the nose daily.   valACYclovir 500 MG tablet Commonly known as:  VALTREX Take 1 tablet (500 mg total) by mouth 2 (two) times daily.            Discharge Care Instructions        Start     Ordered   10/17/16 0000  oxyCODONE (ROXICODONE) 15 MG immediate release tablet    Question:  Supervising Provider  Answer:  Elenora GammaBRADSHAW, SAMUEL L   10/17/16 1542   10/17/16 0000  oxyCODONE (ROXICODONE) 15 MG immediate release tablet      Question:  Supervising Provider  Answer:  Elenora GammaBRADSHAW, SAMUEL L   10/17/16 1542   10/17/16 0000  oxyCODONE (ROXICODONE) 15 MG immediate release tablet    Question:  Supervising Provider  Answer:  Elenora GammaBRADSHAW, SAMUEL L   10/17/16 1542   10/17/16 0000  tiZANidine (ZANAFLEX) 4 MG tablet  Every 8 hours PRN    Question:  Supervising Provider  Answer:  Elenora GammaBRADSHAW, SAMUEL L   10/17/16 1542   10/17/16 0000  valACYclovir (VALTREX) 500 MG tablet  2 times daily    Question:  Supervising Provider  Answer:  Elenora GammaBRADSHAW, SAMUEL L   10/17/16 1542         Objective:    BP 126/90   Pulse 89   Temp (!) 97 F (36.1 C) (Oral)   Ht 5\' 10"  (1.778 m)   Wt 229 lb 9.6 oz (104.1 kg)   BMI 32.94 kg/m   No Known Allergies  Physical Exam  Constitutional: He appears well-developed and well-nourished.  HENT:  Head: Normocephalic and atraumatic.  Eyes: Pupils are equal, round, and reactive to light. Conjunctivae and EOM are normal.  Neck: Normal range of motion. Neck supple.  Cardiovascular: Normal rate, regular rhythm and normal heart sounds.   Pulmonary/Chest: Effort normal and breath sounds normal.  Abdominal: Soft. Bowel sounds are normal.  Musculoskeletal:       Cervical back: He exhibits decreased range of motion, tenderness, deformity and pain.       Lumbar back: He exhibits decreased range of motion, deformity, pain and spasm.  Skin: Skin is warm and dry.        Assessment & Plan:   1. Closed displaced fracture of third cervical vertebra, unspecified fracture morphology, sequela - oxyCODONE (ROXICODONE) 15 MG immediate release tablet; Take 1-2 tabs 4 times a day as needed.  Dispense: 240 tablet; Refill: 0 - oxyCODONE (ROXICODONE) 15 MG immediate release tablet; Take 1-2 tablets 4 times a day as needed. Fill 30 days from original script date  Dispense: 240 tablet; Refill: 0 - oxyCODONE (ROXICODONE) 15 MG immediate release tablet; Take 1-2 tablets 4 times a day as needed. Fill 60 days from original  script date.  Dispense: 240 tablet; Refill: 0  2. Brachial plexopathy - oxyCODONE (ROXICODONE) 15 MG immediate release tablet; Take 1-2 tabs 4 times a day as needed.  Dispense: 240 tablet; Refill: 0 - oxyCODONE (ROXICODONE) 15 MG immediate release tablet; Take 1-2 tablets 4 times a day as needed. Fill 30 days from original script date  Dispense: 240 tablet; Refill: 0 - oxyCODONE (ROXICODONE) 15 MG immediate release tablet; Take 1-2 tablets 4 times a day as needed. Fill 60 days from original script date.  Dispense:  240 tablet; Refill: 0 - tiZANidine (ZANAFLEX) 4 MG tablet; Take 1 tablet (4 mg total) by mouth every 8 (eight) hours as needed.  Dispense: 90 tablet; Refill: 5  3. Injury of left peroneal nerve, - oxyCODONE (ROXICODONE) 15 MG immediate release tablet; Take 1-2 tabs 4 times a day as needed.  Dispense: 240 tablet; Refill: 0 - oxyCODONE (ROXICODONE) 15 MG immediate release tablet; Take 1-2 tablets 4 times a day as needed. Fill 30 days from original script date  Dispense: 240 tablet; Refill: 0 - oxyCODONE (ROXICODONE) 15 MG immediate release tablet; Take 1-2 tablets 4 times a day as needed. Fill 60 days from original script date.  Dispense: 240 tablet; Refill: 0 - tiZANidine (ZANAFLEX) 4 MG tablet; Take 1 tablet (4 mg total) by mouth every 8 (eight) hours as needed.  Dispense: 90 tablet; Refill: 5  4. Primary osteoarthritis involving multiple joints - oxyCODONE (ROXICODONE) 15 MG immediate release tablet; Take 1-2 tabs 4 times a day as needed.  Dispense: 240 tablet; Refill: 0 - oxyCODONE (ROXICODONE) 15 MG immediate release tablet; Take 1-2 tablets 4 times a day as needed. Fill 30 days from original script date  Dispense: 240 tablet; Refill: 0 - oxyCODONE (ROXICODONE) 15 MG immediate release tablet; Take 1-2 tablets 4 times a day as needed. Fill 60 days from original script date.  Dispense: 240 tablet; Refill: 0    Current Outpatient Prescriptions:  .  acetaminophen-codeine (TYLENOL #3)  300-30 MG tablet, Take 2 tablets by mouth every 8 (eight) hours as needed for moderate pain (HEADACHE)., Disp: 30 tablet, Rfl: 0 .  albuterol (PROAIR HFA) 108 (90 Base) MCG/ACT inhaler, USE 2 PUFFS ONCE A DAY, Disp: , Rfl:  .  cetirizine (ZYRTEC) 10 MG tablet, Take 1 tablet (10 mg total) by mouth daily., Disp: 90 tablet, Rfl: 3 .  gabapentin (NEURONTIN) 800 MG tablet, TAKE 1 TABLET (800 MG TOTAL) BY MOUTH 3 (THREE) TIMES DAILY., Disp: 90 tablet, Rfl: 5 .  hydrochlorothiazide (HYDRODIURIL) 25 MG tablet, Take 1 tablet (25 mg total) by mouth daily., Disp: 90 tablet, Rfl: 3 .  meloxicam (MOBIC) 15 MG tablet, Take 1 tablet (15 mg total) by mouth daily., Disp: 30 tablet, Rfl: 11 .  oxyCODONE (ROXICODONE) 15 MG immediate release tablet, Take 1-2 tabs 4 times a day as needed., Disp: 240 tablet, Rfl: 0 .  oxyCODONE (ROXICODONE) 15 MG immediate release tablet, Take 1-2 tablets 4 times a day as needed. Fill 30 days from original script date, Disp: 240 tablet, Rfl: 0 .  SYMBICORT 80-4.5 MCG/ACT inhaler, USE 2 PUFFS TWICE A DAY 30 DAYS INHALATION 30 DAYS, Disp: 10.2 Inhaler, Rfl: 0 .  tiZANidine (ZANAFLEX) 4 MG tablet, Take 1 tablet (4 mg total) by mouth every 8 (eight) hours as needed., Disp: 90 tablet, Rfl: 5 .  triamcinolone (NASACORT) 55 MCG/ACT AERO nasal inhaler, Place 2 sprays into the nose daily., Disp: 1 Inhaler, Rfl: 12 .  valACYclovir (VALTREX) 500 MG tablet, Take 1 tablet (500 mg total) by mouth 2 (two) times daily., Disp: 20 tablet, Rfl: 11 .  oxyCODONE (ROXICODONE) 15 MG immediate release tablet, Take 1-2 tablets 4 times a day as needed. Fill 60 days from original script date., Disp: 240 tablet, Rfl: 0 .  simvastatin (ZOCOR) 20 MG tablet, Take 1 tablet (20 mg total) by mouth daily. (Patient not taking: Reported on 10/17/2016), Disp: 90 tablet, Rfl: 3 Continue all other maintenance medications as listed above.  Follow up plan: Return in about 3 months (around  01/16/2017) for recheck.  Educational  handout given for survey  Remus Loffler PA-C [facility] [address]  [place], [place] [address] [phone]   10/20/2016, 8:40 PM

## 2016-10-23 ENCOUNTER — Other Ambulatory Visit: Payer: Self-pay | Admitting: Physician Assistant

## 2016-10-31 ENCOUNTER — Other Ambulatory Visit: Payer: Self-pay | Admitting: Physician Assistant

## 2016-11-20 ENCOUNTER — Ambulatory Visit (INDEPENDENT_AMBULATORY_CARE_PROVIDER_SITE_OTHER): Payer: Medicare Other

## 2016-11-20 DIAGNOSIS — Z23 Encounter for immunization: Secondary | ICD-10-CM

## 2017-01-16 ENCOUNTER — Telehealth: Payer: Self-pay | Admitting: Physician Assistant

## 2017-01-16 DIAGNOSIS — M159 Polyosteoarthritis, unspecified: Secondary | ICD-10-CM

## 2017-01-16 DIAGNOSIS — M150 Primary generalized (osteo)arthritis: Secondary | ICD-10-CM

## 2017-01-16 DIAGNOSIS — S8412XA Injury of peroneal nerve at lower leg level, left leg, initial encounter: Secondary | ICD-10-CM

## 2017-01-16 DIAGNOSIS — G540 Brachial plexus disorders: Secondary | ICD-10-CM

## 2017-01-16 DIAGNOSIS — S12200S Unspecified displaced fracture of third cervical vertebra, sequela: Secondary | ICD-10-CM

## 2017-01-16 MED ORDER — OXYCODONE HCL 15 MG PO TABS: ORAL_TABLET | ORAL | 0 refills | Status: DC

## 2017-01-16 NOTE — Telephone Encounter (Signed)
Patient is going to be out of his oxycodone. Can he get an rx for enough until 12/17 since we are having to reschedule his appointment

## 2017-01-16 NOTE — Telephone Encounter (Signed)
Keith Robertson came into the office and filled rx for patient.

## 2017-01-19 ENCOUNTER — Ambulatory Visit: Payer: Medicare Other | Admitting: Physician Assistant

## 2017-01-26 ENCOUNTER — Ambulatory Visit (INDEPENDENT_AMBULATORY_CARE_PROVIDER_SITE_OTHER): Payer: Medicare Other | Admitting: Physician Assistant

## 2017-01-26 ENCOUNTER — Encounter: Payer: Self-pay | Admitting: Physician Assistant

## 2017-01-26 VITALS — BP 130/80 | HR 109 | Temp 98.4°F | Ht 70.0 in | Wt 243.4 lb

## 2017-01-26 DIAGNOSIS — J329 Chronic sinusitis, unspecified: Secondary | ICD-10-CM | POA: Diagnosis not present

## 2017-01-26 DIAGNOSIS — S12200S Unspecified displaced fracture of third cervical vertebra, sequela: Secondary | ICD-10-CM

## 2017-01-26 DIAGNOSIS — G540 Brachial plexus disorders: Secondary | ICD-10-CM

## 2017-01-26 DIAGNOSIS — M159 Polyosteoarthritis, unspecified: Secondary | ICD-10-CM

## 2017-01-26 DIAGNOSIS — S8412XA Injury of peroneal nerve at lower leg level, left leg, initial encounter: Secondary | ICD-10-CM

## 2017-01-26 DIAGNOSIS — M150 Primary generalized (osteo)arthritis: Secondary | ICD-10-CM | POA: Diagnosis not present

## 2017-01-26 DIAGNOSIS — B001 Herpesviral vesicular dermatitis: Secondary | ICD-10-CM | POA: Diagnosis not present

## 2017-01-26 MED ORDER — AZITHROMYCIN 250 MG PO TABS: ORAL_TABLET | ORAL | 0 refills | Status: DC

## 2017-01-26 MED ORDER — OXYCODONE HCL 15 MG PO TABS: ORAL_TABLET | ORAL | 0 refills | Status: DC

## 2017-01-26 MED ORDER — VALACYCLOVIR HCL 1 G PO TABS: 1000.0000 mg | ORAL_TABLET | Freq: Two times a day (BID) | ORAL | 11 refills | Status: DC

## 2017-01-26 NOTE — Patient Instructions (Signed)
In a few days you may receive a survey in the mail or online from [employer] regarding your visit with us today. Please take a moment to fill this out. Your feedback is very important to our whole office. It can help us better understand your needs as well as improve your experience and satisfaction. Thank you for taking your time to complete it. We care about you.  Claritza July, PA-C  

## 2017-01-26 NOTE — Progress Notes (Signed)
BP 130/80   Pulse (!) 109   Temp 98.4 F (36.9 C) (Oral)   Ht 5\' 10"  (1.778 m)   Wt 243 lb 6.4 oz (110.4 kg)   BMI 34.92 kg/m    Subjective:    Patient ID: Keith Robertson, male    DOB: 1965/05/08, 51 y.o.   MRN: [id number]  HPI: Keith Robertson is a 51 y.o. male presenting on 01/26/2017 for Follow-up (3 month )  This patient has had many days of sinus headache and postnasal drainage. There is copious drainage at times. Denies any fever at this time. There has been a history of sinus infections in the past.  No history of sinus surgery. There is cough at night. It has become more prevalent in recent days.  This patient comes in for periodic recheck on medications and conditions including patient long-term pain due to neck injury and peroneal nerve injury.  He has osteoarthritis of multiple joints.  He is having more fever blisters also at this time.   All medications are reviewed today. There are no reports of any problems with the medications. All of the medical conditions are reviewed and updated.  Lab work is reviewed and will be ordered as medically necessary. There are no new problems reported with today's visit.   Relevant past medical, surgical, family and social history reviewed and updated as indicated. Allergies and medications reviewed and updated.  Past Medical History:  Diagnosis Date  . Broken femur (HCC)   . Broken leg   . Broken neck (HCC)   . Hypertension   . Muscle, jerky movements (uncontrolled)     Past Surgical History:  Procedure Laterality Date  . ANKLE SURGERY Right   . BACK SURGERY    . Robertson SURGERY Left     Review of Systems  Constitutional: Negative.  Negative for appetite change and fatigue.  HENT: Negative.   Eyes: Negative.  Negative for pain and visual disturbance.  Respiratory: Negative.  Negative for cough, chest tightness, shortness of breath and wheezing.   Cardiovascular: Negative.  Negative for chest pain, palpitations and leg swelling.   Gastrointestinal: Negative.  Negative for abdominal pain, diarrhea, nausea and vomiting.  Endocrine: Negative.   Genitourinary: Negative.   Musculoskeletal: Positive for arthralgias, back pain, gait problem, joint swelling, myalgias, neck pain and neck stiffness.  Skin: Negative.  Negative for color change and rash.  Neurological: Positive for tremors and numbness. Negative for weakness and headaches.  Psychiatric/Behavioral: Negative.     Allergies as of 01/26/2017   No Known Allergies     Medication List        Accurate as of 01/26/17  3:00 PM. Always use your most recent med list.          acetaminophen-codeine 300-30 MG tablet Commonly known as:  TYLENOL #3 Take 2 tablets by mouth every 8 (eight) hours as needed for moderate pain (HEADACHE).   azithromycin 250 MG tablet Commonly known as:  ZITHROMAX Z-PAK Take as directed   cetirizine 10 MG tablet Commonly known as:  ZYRTEC Take 1 tablet (10 mg total) by mouth daily.   gabapentin 800 MG tablet Commonly known as:  NEURONTIN TAKE 1 TABLET (800 MG TOTAL) BY MOUTH 3 (THREE) TIMES DAILY.   hydrochlorothiazide 25 MG tablet Commonly known as:  HYDRODIURIL Take 1 tablet (25 mg total) by mouth daily.   meloxicam 15 MG tablet Commonly known as:  MOBIC TAKE 1 TABLET (15 MG TOTAL) BY MOUTH DAILY.  oxyCODONE 15 MG immediate release tablet Commonly known as:  ROXICODONE Take 1-2 tablets 4 times a day as needed. Fill 30 days from original script date   oxyCODONE 15 MG immediate release tablet Commonly known as:  ROXICODONE Take 1-2 tablets 4 times a day as needed. Fill 60 days from original script date.   oxyCODONE 15 MG immediate release tablet Commonly known as:  ROXICODONE Take 1-2 tabs 4 times a day as needed.   PROAIR HFA 108 (90 Base) MCG/ACT inhaler Generic drug:  albuterol USE 2 PUFFS ONCE A DAY   simvastatin 20 MG tablet Commonly known as:  ZOCOR Take 1 tablet (20 mg total) by mouth daily.   SYMBICORT  80-4.5 MCG/ACT inhaler Generic drug:  budesonide-formoterol USE 2 PUFFS TWICE A DAY 30 DAYS INHALATION 30 DAYS   tiZANidine 4 MG tablet Commonly known as:  ZANAFLEX Take 1 tablet (4 mg total) by mouth every 8 (eight) hours as needed.   triamcinolone 55 MCG/ACT Aero nasal inhaler Commonly known as:  NASACORT Place 2 sprays into the nose daily.   valACYclovir 1000 MG tablet Commonly known as:  VALTREX Take 1 tablet (1,000 mg total) by mouth 2 (two) times daily. Then one daily as maintenance          Objective:    BP 130/80   Pulse (!) 109   Temp 98.4 F (36.9 C) (Oral)   Ht 5\' 10"  (1.778 m)   Wt 243 lb 6.4 oz (110.4 kg)   BMI 34.92 kg/m   No Known Allergies  Physical Exam  Constitutional: He is oriented to person, place, and time. He appears well-developed and well-nourished.  HENT:  Head: Normocephalic and atraumatic.  Right Ear: Tympanic membrane and external ear normal. No middle ear effusion.  Left Ear: Tympanic membrane and external ear normal.  No middle ear effusion.  Nose: Mucosal edema and rhinorrhea present. Right sinus exhibits no maxillary sinus tenderness. Left sinus exhibits no maxillary sinus tenderness.  Mouth/Throat: Uvula is midline. Posterior oropharyngeal erythema present.  Eyes: Conjunctivae and EOM are normal. Pupils are equal, round, and reactive to light. Right eye exhibits no discharge. Left eye exhibits no discharge.  Neck: Normal range of motion.  Cardiovascular: Normal rate, regular rhythm and normal heart sounds.  Pulmonary/Chest: Effort normal and breath sounds normal. No respiratory distress. He has no wheezes.  Abdominal: Soft.  Musculoskeletal:       Left Robertson: He exhibits deformity. Tenderness found.       Cervical back: He exhibits decreased range of motion, pain and spasm.       Back:       Legs: Lymphadenopathy:    He has no cervical adenopathy.  Neurological: He is alert and oriented to person, place, and time.  Skin: Skin is  warm and dry.  Psychiatric: He has a normal mood and affect.        Assessment & Plan:   1. Closed displaced fracture of third cervical vertebra, unspecified fracture morphology, sequela - oxyCODONE (ROXICODONE) 15 MG immediate release tablet; Take 1-2 tablets 4 times a day as needed. Fill 30 days from original script date  Dispense: 240 tablet; Refill: 0 - oxyCODONE (ROXICODONE) 15 MG immediate release tablet; Take 1-2 tablets 4 times a day as needed. Fill 60 days from original script date.  Dispense: 240 tablet; Refill: 0 - oxyCODONE (ROXICODONE) 15 MG immediate release tablet; Take 1-2 tabs 4 times a day as needed.  Dispense: 240 tablet; Refill: 0  2.  Brachial plexopathy - oxyCODONE (ROXICODONE) 15 MG immediate release tablet; Take 1-2 tablets 4 times a day as needed. Fill 30 days from original script date  Dispense: 240 tablet; Refill: 0 - oxyCODONE (ROXICODONE) 15 MG immediate release tablet; Take 1-2 tablets 4 times a day as needed. Fill 60 days from original script date.  Dispense: 240 tablet; Refill: 0 - oxyCODONE (ROXICODONE) 15 MG immediate release tablet; Take 1-2 tabs 4 times a day as needed.  Dispense: 240 tablet; Refill: 0  3. Injury of left peroneal nerve, initial encounter - oxyCODONE (ROXICODONE) 15 MG immediate release tablet; Take 1-2 tablets 4 times a day as needed. Fill 30 days from original script date  Dispense: 240 tablet; Refill: 0 - oxyCODONE (ROXICODONE) 15 MG immediate release tablet; Take 1-2 tablets 4 times a day as needed. Fill 60 days from original script date.  Dispense: 240 tablet; Refill: 0 - oxyCODONE (ROXICODONE) 15 MG immediate release tablet; Take 1-2 tabs 4 times a day as needed.  Dispense: 240 tablet; Refill: 0  4. Primary osteoarthritis involving multiple joints - oxyCODONE (ROXICODONE) 15 MG immediate release tablet; Take 1-2 tablets 4 times a day as needed. Fill 30 days from original script date  Dispense: 240 tablet; Refill: 0 - oxyCODONE  (ROXICODONE) 15 MG immediate release tablet; Take 1-2 tablets 4 times a day as needed. Fill 60 days from original script date.  Dispense: 240 tablet; Refill: 0 - oxyCODONE (ROXICODONE) 15 MG immediate release tablet; Take 1-2 tabs 4 times a day as needed.  Dispense: 240 tablet; Refill: 0  5. Fever blister - valACYclovir (VALTREX) 1000 MG tablet; Take 1 tablet (1,000 mg total) by mouth 2 (two) times daily. Then one daily as maintenance  Dispense: 40 tablet; Refill: 11  6. Sinusitis, unspecified chronicity, unspecified location    Current Outpatient Medications:  .  acetaminophen-codeine (TYLENOL #3) 300-30 MG tablet, Take 2 tablets by mouth every 8 (eight) hours as needed for moderate pain (HEADACHE)., Disp: 30 tablet, Rfl: 0 .  albuterol (PROAIR HFA) 108 (90 Base) MCG/ACT inhaler, USE 2 PUFFS ONCE A DAY, Disp: , Rfl:  .  cetirizine (ZYRTEC) 10 MG tablet, Take 1 tablet (10 mg total) by mouth daily., Disp: 90 tablet, Rfl: 3 .  gabapentin (NEURONTIN) 800 MG tablet, TAKE 1 TABLET (800 MG TOTAL) BY MOUTH 3 (THREE) TIMES DAILY., Disp: 90 tablet, Rfl: 5 .  hydrochlorothiazide (HYDRODIURIL) 25 MG tablet, Take 1 tablet (25 mg total) by mouth daily., Disp: 90 tablet, Rfl: 3 .  meloxicam (MOBIC) 15 MG tablet, TAKE 1 TABLET (15 MG TOTAL) BY MOUTH DAILY., Disp: 30 tablet, Rfl: 2 .  oxyCODONE (ROXICODONE) 15 MG immediate release tablet, Take 1-2 tablets 4 times a day as needed. Fill 30 days from original script date, Disp: 240 tablet, Rfl: 0 .  oxyCODONE (ROXICODONE) 15 MG immediate release tablet, Take 1-2 tablets 4 times a day as needed. Fill 60 days from original script date., Disp: 240 tablet, Rfl: 0 .  oxyCODONE (ROXICODONE) 15 MG immediate release tablet, Take 1-2 tabs 4 times a day as needed., Disp: 240 tablet, Rfl: 0 .  simvastatin (ZOCOR) 20 MG tablet, Take 1 tablet (20 mg total) by mouth daily., Disp: 90 tablet, Rfl: 3 .  SYMBICORT 80-4.5 MCG/ACT inhaler, USE 2 PUFFS TWICE A DAY 30 DAYS INHALATION  30 DAYS, Disp: 10.2 Inhaler, Rfl: 5 .  tiZANidine (ZANAFLEX) 4 MG tablet, Take 1 tablet (4 mg total) by mouth every 8 (eight) hours as needed., Disp: 90  tablet, Rfl: 5 .  triamcinolone (NASACORT) 55 MCG/ACT AERO nasal inhaler, Place 2 sprays into the nose daily., Disp: 1 Inhaler, Rfl: 12 .  valACYclovir (VALTREX) 1000 MG tablet, Take 1 tablet (1,000 mg total) by mouth 2 (two) times daily. Then one daily as maintenance, Disp: 40 tablet, Rfl: 11 .  azithromycin (ZITHROMAX Z-PAK) 250 MG tablet, Take as directed, Disp: 6 each, Rfl: 0 Continue all other maintenance medications as listed above.  Follow up plan: Return in about 3 months (around 04/26/2017) for recheck.  Educational handout given for survey  Remus LofflerAngel S. Cynai Skeens PA-C [facility] [address]  [place], [place] [address] [phone]   01/26/2017, 3:00 PM

## 2017-01-31 ENCOUNTER — Other Ambulatory Visit: Payer: Self-pay | Admitting: Physician Assistant

## 2017-03-04 ENCOUNTER — Other Ambulatory Visit: Payer: Self-pay | Admitting: Physician Assistant

## 2017-03-23 ENCOUNTER — Other Ambulatory Visit: Payer: Self-pay | Admitting: Physician Assistant

## 2017-04-05 ENCOUNTER — Other Ambulatory Visit: Payer: Self-pay | Admitting: Physician Assistant

## 2017-04-21 ENCOUNTER — Other Ambulatory Visit: Payer: Self-pay | Admitting: Physician Assistant

## 2017-04-27 ENCOUNTER — Encounter: Payer: Self-pay | Admitting: Physician Assistant

## 2017-04-27 ENCOUNTER — Ambulatory Visit (INDEPENDENT_AMBULATORY_CARE_PROVIDER_SITE_OTHER): Payer: Medicare Other | Admitting: Physician Assistant

## 2017-04-27 DIAGNOSIS — S12200S Unspecified displaced fracture of third cervical vertebra, sequela: Secondary | ICD-10-CM | POA: Diagnosis not present

## 2017-04-27 DIAGNOSIS — M150 Primary generalized (osteo)arthritis: Secondary | ICD-10-CM | POA: Diagnosis not present

## 2017-04-27 DIAGNOSIS — S8412XA Injury of peroneal nerve at lower leg level, left leg, initial encounter: Secondary | ICD-10-CM

## 2017-04-27 DIAGNOSIS — M159 Polyosteoarthritis, unspecified: Secondary | ICD-10-CM

## 2017-04-27 DIAGNOSIS — G540 Brachial plexus disorders: Secondary | ICD-10-CM

## 2017-04-27 MED ORDER — OXYCODONE HCL 15 MG PO TABS
ORAL_TABLET | ORAL | 0 refills | Status: AC
Start: 2017-05-15 — End: 2017-06-12

## 2017-04-27 MED ORDER — OXYCODONE HCL 15 MG PO TABS: ORAL_TABLET | ORAL | 0 refills | Status: AC

## 2017-04-27 MED ORDER — OXYCODONE HCL 15 MG PO TABS
ORAL_TABLET | ORAL | 0 refills | Status: DC
Start: 2017-07-13 — End: 2017-07-29

## 2017-04-28 NOTE — Progress Notes (Signed)
BP 122/85 (BP Location: Left Arm)   Pulse 82   Temp (!) 97.4 F (36.3 C) (Oral)   Ht 5' 10"  (1.778 m)   Wt 243 lb (110.2 kg)   BMI 34.87 kg/m    Subjective:    Patient ID: Keith Robertson, male    DOB: 1965/12/14, 52 y.o.   MRN: [id number]  HPI: Keith Robertson is a 52 y.o. male presenting on 04/27/2017 for Follow-up (3 mo)  Chronic pain is under control, patient has no new complaints. Medications are keeping things stable. Needs refills for the next three months.  [place] Controlled Substance website checked and normal. Drug screen normal this year.   Past Medical History:  Diagnosis Date  . Broken femur ([place])   . Broken leg   . Broken neck ([place])   . Hypertension   . Muscle, jerky movements (uncontrolled)    Relevant past medical, surgical, family and social history reviewed and updated as indicated. Interim medical history since our last visit reviewed. Allergies and medications reviewed and updated. DATA REVIEWED: CHART IN EPIC  Family History reviewed for pertinent findings.  Review of Systems  Constitutional: Negative.  Negative for appetite change and fatigue.  Eyes: Negative for pain and visual disturbance.  Respiratory: Negative.  Negative for cough, chest tightness, shortness of breath and wheezing.   Cardiovascular: Negative.  Negative for chest pain, palpitations and leg swelling.  Gastrointestinal: Negative.  Negative for abdominal pain, diarrhea, nausea and vomiting.  Genitourinary: Negative.   Musculoskeletal: Positive for arthralgias, back pain, gait problem, joint swelling, myalgias, neck pain and neck stiffness.  Skin: Negative.  Negative for color change and rash.  Neurological: Negative for weakness, numbness and headaches.  Psychiatric/Behavioral: Negative.     Allergies as of 04/27/2017   No Known Allergies     Medication List        Accurate as of 04/27/17 11:59 PM. Always use your most recent med list.          acetaminophen-codeine 300-30 MG  tablet Commonly known as:  TYLENOL #3 Take 2 tablets by mouth every 8 (eight) hours as needed for moderate pain (HEADACHE).   cetirizine 10 MG tablet Commonly known as:  ZYRTEC Take 1 tablet (10 mg total) by mouth daily.   gabapentin 800 MG tablet Commonly known as:  NEURONTIN TAKE 1 TABLET (800 MG TOTAL) BY MOUTH 3 (THREE) TIMES DAILY.   hydrochlorothiazide 25 MG tablet Commonly known as:  HYDRODIURIL Take 1 tablet (25 mg total) by mouth daily.   meloxicam 15 MG tablet Commonly known as:  MOBIC TAKE 1 TABLET (15 MG TOTAL) BY MOUTH DAILY.   oxyCODONE 15 MG immediate release tablet Commonly known as:  ROXICODONE Take 1-2 tabs 4 times a day as needed. Start taking on:  05/15/2017   oxyCODONE 15 MG immediate release tablet Commonly known as:  ROXICODONE Take 1-2 tablets 4 times a day as needed. Start taking on:  06/12/2017   oxyCODONE 15 MG immediate release tablet Commonly known as:  ROXICODONE Take 1-2 tablets 4 times a day as needed. Start taking on:  07/13/2017   PROAIR HFA 108 (90 Base) MCG/ACT inhaler Generic drug:  albuterol USE 2 PUFFS ONCE A DAY   simvastatin 20 MG tablet Commonly known as:  ZOCOR Take 1 tablet (20 mg total) by mouth daily.   SYMBICORT 80-4.5 MCG/ACT inhaler Generic drug:  budesonide-formoterol USE 2 PUFFS TWICE A DAY 30 DAYS INHALATION 30 DAYS   tiZANidine 4 MG tablet  Commonly known as:  ZANAFLEX Take 1 tablet (4 mg total) by mouth every 8 (eight) hours as needed.   triamcinolone 55 MCG/ACT Aero nasal inhaler Commonly known as:  NASACORT Place 2 sprays into the nose daily.   valACYclovir 1000 MG tablet Commonly known as:  VALTREX Take 1 tablet (1,000 mg total) by mouth 2 (two) times daily. Then one daily as maintenance          Objective:    BP 122/85 (BP Location: Left Arm)   Pulse 82   Temp (!) 97.4 F (36.3 C) (Oral)   Ht 5' 10"  (1.778 m)   Wt 243 lb (110.2 kg)   BMI 34.87 kg/m   No Known Allergies  Wt Readings from Last  3 Encounters:  04/27/17 243 lb (110.2 kg)  01/26/17 243 lb 6.4 oz (110.4 kg)  10/17/16 229 lb 9.6 oz (104.1 kg)    Physical Exam  Constitutional: He appears well-developed and well-nourished. No distress.  HENT:  Head: Normocephalic and atraumatic.  Eyes: Conjunctivae and EOM are normal. Pupils are equal, round, and reactive to light.  Cardiovascular: Normal rate, regular rhythm and normal heart sounds.  Pulmonary/Chest: Effort normal and breath sounds normal. No respiratory distress.  Musculoskeletal:       Cervical back: He exhibits decreased range of motion, tenderness, deformity and pain.       Back:  Skin: Skin is warm and dry.  Psychiatric: He has a normal mood and affect. His behavior is normal.  Nursing note and vitals reviewed.   Results for orders placed or performed in visit on 04/24/16  CMP14+EGFR  Result Value Ref Range   Glucose 88 65 - 99 mg/dL   BUN 11 6 - 24 mg/dL   Creatinine, Ser 0.86 0.76 - 1.27 mg/dL   GFR calc non Af Amer 102 >59 mL/min/1.73   GFR calc Af Amer 118 >59 mL/min/1.73   BUN/Creatinine Ratio 13 9 - 20   Sodium 141 134 - 144 mmol/L   Potassium 4.2 3.5 - 5.2 mmol/L   Chloride 101 96 - 106 mmol/L   CO2 24 18 - 29 mmol/L   Calcium 9.3 8.7 - 10.2 mg/dL   Total Protein 6.1 6.0 - 8.5 g/dL   Albumin 4.0 3.5 - 5.5 g/dL   Globulin, Total 2.1 1.5 - 4.5 g/dL   Albumin/Globulin Ratio 1.9 1.2 - 2.2   Bilirubin Total 0.4 0.0 - 1.2 mg/dL   Alkaline Phosphatase 63 39 - 117 IU/L   AST 39 0 - 40 IU/L   ALT 46 (H) 0 - 44 IU/L  Lipid panel  Result Value Ref Range   Cholesterol, Total 132 100 - 199 mg/dL   Triglycerides 76 0 - 149 mg/dL   HDL 58 >39 mg/dL   VLDL Cholesterol Cal 15 5 - 40 mg/dL   LDL Calculated 59 0 - 99 mg/dL   Chol/HDL Ratio 2.3 0.0 - 5.0 ratio units  CBC with Differential/Platelet  Result Value Ref Range   WBC 6.2 3.4 - 10.8 x10E3/uL   RBC 4.03 (L) 4.14 - 5.80 x10E6/uL   Hemoglobin 12.7 (L) 13.0 - 17.7 g/dL   Hematocrit 37.6 37.5 -  51.0 %   MCV 93 79 - 97 fL   MCH 31.5 26.6 - 33.0 pg   MCHC 33.8 31.5 - 35.7 g/dL   RDW 13.8 12.3 - 15.4 %   Platelets 326 150 - 379 x10E3/uL   Neutrophils 48 Not Estab. %   Lymphs 33 Not Estab. %  Monocytes 12 Not Estab. %   Eos 6 Not Estab. %   Basos 1 Not Estab. %   Neutrophils Absolute 3.0 1.4 - 7.0 x10E3/uL   Lymphocytes Absolute 2.0 0.7 - 3.1 x10E3/uL   Monocytes Absolute 0.7 0.1 - 0.9 x10E3/uL   EOS (ABSOLUTE) 0.4 0.0 - 0.4 x10E3/uL   Basophils Absolute 0.0 0.0 - 0.2 x10E3/uL   Immature Granulocytes 0 Not Estab. %   Immature Grans (Abs) 0.0 0.0 - 0.1 x10E3/uL      Assessment & Plan:   1. Closed displaced fracture of third cervical vertebra, unspecified fracture morphology, sequela - oxyCODONE (ROXICODONE) 15 MG immediate release tablet; Take 1-2 tablets 4 times a day as needed.  Dispense: 240 tablet; Refill: 0 - oxyCODONE (ROXICODONE) 15 MG immediate release tablet; Take 1-2 tablets 4 times a day as needed.  Dispense: 240 tablet; Refill: 0 - oxyCODONE (ROXICODONE) 15 MG immediate release tablet; Take 1-2 tabs 4 times a day as needed.  Dispense: 240 tablet; Refill: 0  2. Brachial plexopathy - oxyCODONE (ROXICODONE) 15 MG immediate release tablet; Take 1-2 tablets 4 times a day as needed.  Dispense: 240 tablet; Refill: 0 - oxyCODONE (ROXICODONE) 15 MG immediate release tablet; Take 1-2 tablets 4 times a day as needed.  Dispense: 240 tablet; Refill: 0 - oxyCODONE (ROXICODONE) 15 MG immediate release tablet; Take 1-2 tabs 4 times a day as needed.  Dispense: 240 tablet; Refill: 0  3. Injury of left peroneal nerve, initial encounter - oxyCODONE (ROXICODONE) 15 MG immediate release tablet; Take 1-2 tablets 4 times a day as needed.  Dispense: 240 tablet; Refill: 0 - oxyCODONE (ROXICODONE) 15 MG immediate release tablet; Take 1-2 tablets 4 times a day as needed.  Dispense: 240 tablet; Refill: 0 - oxyCODONE (ROXICODONE) 15 MG immediate release tablet; Take 1-2 tabs 4 times a day as  needed.  Dispense: 240 tablet; Refill: 0  4. Primary osteoarthritis involving multiple joints - oxyCODONE (ROXICODONE) 15 MG immediate release tablet; Take 1-2 tablets 4 times a day as needed.  Dispense: 240 tablet; Refill: 0 - oxyCODONE (ROXICODONE) 15 MG immediate release tablet; Take 1-2 tablets 4 times a day as needed.  Dispense: 240 tablet; Refill: 0 - oxyCODONE (ROXICODONE) 15 MG immediate release tablet; Take 1-2 tabs 4 times a day as needed.  Dispense: 240 tablet; Refill: 0   Continue all other maintenance medications as listed above.  Follow up plan: Return in about 3 months (around 07/28/2017) for recheck.  Educational handout given for [place] PA-C [place] [address]  [place], [place] [address] [phone]   04/28/2017, 8:49 AM

## 2017-05-05 ENCOUNTER — Other Ambulatory Visit: Payer: Self-pay | Admitting: Physician Assistant

## 2017-05-05 DIAGNOSIS — S8412XA Injury of peroneal nerve at lower leg level, left leg, initial encounter: Secondary | ICD-10-CM

## 2017-05-05 DIAGNOSIS — G540 Brachial plexus disorders: Secondary | ICD-10-CM

## 2017-05-16 ENCOUNTER — Other Ambulatory Visit: Payer: Self-pay | Admitting: Physician Assistant

## 2017-05-17 ENCOUNTER — Other Ambulatory Visit: Payer: Self-pay | Admitting: Physician Assistant

## 2017-05-17 DIAGNOSIS — S8412XA Injury of peroneal nerve at lower leg level, left leg, initial encounter: Secondary | ICD-10-CM

## 2017-05-17 DIAGNOSIS — G540 Brachial plexus disorders: Secondary | ICD-10-CM

## 2017-05-18 ENCOUNTER — Other Ambulatory Visit: Payer: Self-pay | Admitting: Physician Assistant

## 2017-05-18 MED ORDER — ACETAMINOPHEN-CODEINE #3 300-30 MG PO TABS: 2.0000 | ORAL_TABLET | Freq: Three times a day (TID) | ORAL | 0 refills | Status: DC | PRN

## 2017-05-18 MED ORDER — GABAPENTIN 800 MG PO TABS: 800.0000 mg | ORAL_TABLET | Freq: Three times a day (TID) | ORAL | 0 refills | Status: DC

## 2017-05-18 MED ORDER — TIZANIDINE HCL 4 MG PO TABS: 4.0000 mg | ORAL_TABLET | Freq: Three times a day (TID) | ORAL | 0 refills | Status: DC | PRN

## 2017-05-19 ENCOUNTER — Encounter: Payer: Self-pay | Admitting: *Deleted

## 2017-06-03 ENCOUNTER — Other Ambulatory Visit: Payer: Self-pay | Admitting: Physician Assistant

## 2017-06-03 DIAGNOSIS — J309 Allergic rhinitis, unspecified: Secondary | ICD-10-CM

## 2017-06-09 ENCOUNTER — Other Ambulatory Visit: Payer: Self-pay | Admitting: Physician Assistant

## 2017-06-09 NOTE — Telephone Encounter (Signed)
Last seen 3/19  Keith Robertson 

## 2017-06-12 ENCOUNTER — Other Ambulatory Visit: Payer: Self-pay | Admitting: Physician Assistant

## 2017-06-12 DIAGNOSIS — E782 Mixed hyperlipidemia: Secondary | ICD-10-CM

## 2017-07-01 ENCOUNTER — Other Ambulatory Visit: Payer: Self-pay | Admitting: Physician Assistant

## 2017-07-11 ENCOUNTER — Other Ambulatory Visit: Payer: Self-pay | Admitting: Physician Assistant

## 2017-07-11 DIAGNOSIS — G540 Brachial plexus disorders: Secondary | ICD-10-CM

## 2017-07-11 DIAGNOSIS — S8412XA Injury of peroneal nerve at lower leg level, left leg, initial encounter: Secondary | ICD-10-CM

## 2017-07-29 ENCOUNTER — Ambulatory Visit (INDEPENDENT_AMBULATORY_CARE_PROVIDER_SITE_OTHER): Payer: Medicare Other | Admitting: Physician Assistant

## 2017-07-29 ENCOUNTER — Encounter: Payer: Self-pay | Admitting: Physician Assistant

## 2017-07-29 VITALS — BP 122/85 | HR 83 | Temp 97.9°F | Ht 70.0 in | Wt 242.8 lb

## 2017-07-29 DIAGNOSIS — S12200S Unspecified displaced fracture of third cervical vertebra, sequela: Secondary | ICD-10-CM

## 2017-07-29 DIAGNOSIS — Z0000 Encounter for general adult medical examination without abnormal findings: Secondary | ICD-10-CM

## 2017-07-29 DIAGNOSIS — E782 Mixed hyperlipidemia: Secondary | ICD-10-CM

## 2017-07-29 DIAGNOSIS — R5383 Other fatigue: Secondary | ICD-10-CM | POA: Diagnosis not present

## 2017-07-29 DIAGNOSIS — S8412XA Injury of peroneal nerve at lower leg level, left leg, initial encounter: Secondary | ICD-10-CM

## 2017-07-29 DIAGNOSIS — G540 Brachial plexus disorders: Secondary | ICD-10-CM

## 2017-07-29 DIAGNOSIS — M159 Polyosteoarthritis, unspecified: Secondary | ICD-10-CM

## 2017-07-29 DIAGNOSIS — M150 Primary generalized (osteo)arthritis: Secondary | ICD-10-CM | POA: Diagnosis not present

## 2017-07-29 MED ORDER — OXYCODONE HCL 15 MG PO TABS: 15.0000 mg | ORAL_TABLET | Freq: Four times a day (QID) | ORAL | 0 refills | Status: DC | PRN

## 2017-07-29 MED ORDER — OXYCODONE HCL 15 MG PO TABS
ORAL_TABLET | ORAL | 0 refills | Status: AC
Start: 2017-07-29 — End: 2017-08-27

## 2017-07-29 MED ORDER — SIMVASTATIN 20 MG PO TABS: 20.0000 mg | ORAL_TABLET | Freq: Every day | ORAL | 3 refills | Status: DC

## 2017-07-29 MED ORDER — TIZANIDINE HCL 4 MG PO TABS: 4.0000 mg | ORAL_TABLET | Freq: Three times a day (TID) | ORAL | 2 refills | Status: DC | PRN

## 2017-07-29 MED ORDER — MELOXICAM 15 MG PO TABS: 15.0000 mg | ORAL_TABLET | Freq: Every day | ORAL | 11 refills | Status: DC

## 2017-07-29 NOTE — Progress Notes (Signed)
BP 122/85   Pulse 83   Temp 97.9 F (36.6 C) (Oral)   Ht 5' 10"  (1.778 m)   Wt 242 lb 12.8 oz (110.1 kg)   BMI 34.84 kg/m    Subjective:    Patient ID: Keith Robertson, male    DOB: Jun 01, 1965, 52 y.o.   MRN: [id number]  HPI: Keith Robertson is a 52 y.o. male presenting on 07/29/2017 for Hyperlipidemia and Chronic pain medication refill  This patient comes in for periodic recheck on medications and conditions including chronic pain related to old vertebrae fractures brachial plexopathies, peroneal nerve pain, arthritis, hyperlipidemia.  He also reports some fatigue.  He reports no other issues at this time.  His medications are reviewed.  Labs will be performed today..   All medications are reviewed today. There are no reports of any problems with the medications. All of the medical conditions are reviewed and updated.  Lab work is reviewed and will be ordered as medically necessary. There are no new problems reported with today's visit.   Past Medical History:  Diagnosis Date  . Broken femur ([place])   . Broken leg   . Broken neck ([place])   . Hypertension   . Muscle, jerky movements (uncontrolled)    Relevant past medical, surgical, family and social history reviewed and updated as indicated. Interim medical history since our last visit reviewed. Allergies and medications reviewed and updated. DATA REVIEWED: CHART IN EPIC  Family History reviewed for pertinent findings.  Review of Systems  Constitutional: Negative.  Negative for appetite change and fatigue.  Eyes: Negative for pain and visual disturbance.  Respiratory: Negative.  Negative for cough, chest tightness, shortness of breath and wheezing.   Cardiovascular: Negative.  Negative for chest pain, palpitations and leg swelling.  Gastrointestinal: Negative.  Negative for abdominal pain, diarrhea, nausea and vomiting.  Genitourinary: Negative.   Skin: Negative.  Negative for color change and rash.  Neurological: Negative.   Negative for weakness, numbness and headaches.  Psychiatric/Behavioral: Negative.     Allergies as of 07/29/2017   No Known Allergies     Medication List        Accurate as of 07/29/17 10:46 PM. Always use your most recent med list.          acetaminophen-codeine 300-30 MG tablet Commonly known as:  TYLENOL #3 Take 2 tablets by mouth every 8 (eight) hours as needed for moderate pain (HEADACHE).   cetirizine 10 MG tablet Commonly known as:  ZYRTEC TAKE 1 TABLET (10 MG TOTAL) BY MOUTH DAILY.   gabapentin 800 MG tablet Commonly known as:  NEURONTIN TAKE 1 TABLET BY MOUTH THREE TIMES A DAY   hydrochlorothiazide 25 MG tablet Commonly known as:  HYDRODIURIL TAKE 1 TABLET (25 MG TOTAL) BY MOUTH DAILY.   meloxicam 15 MG tablet Commonly known as:  MOBIC Take 1 tablet (15 mg total) by mouth daily.   oxyCODONE 15 MG immediate release tablet Commonly known as:  ROXICODONE Take 1-2 tablets 4 times a day as needed.   oxyCODONE 15 MG immediate release tablet Commonly known as:  ROXICODONE Take 1-2 tablets (15-30 mg total) by mouth every 6 (six) hours as needed for pain.   oxyCODONE 15 MG immediate release tablet Commonly known as:  ROXICODONE Take 1-2 tablets (15-30 mg total) by mouth every 6 (six) hours as needed for pain.   PROAIR HFA 108 (90 Base) MCG/ACT inhaler Generic drug:  albuterol USE 2 PUFFS ONCE A DAY  simvastatin 20 MG tablet Commonly known as:  ZOCOR Take 1 tablet (20 mg total) by mouth daily.   SYMBICORT 80-4.5 MCG/ACT inhaler Generic drug:  budesonide-formoterol USE 2 PUFFS TWICE A DAY 30 DAYS INHALATION 30 DAYS   tiZANidine 4 MG tablet Commonly known as:  ZANAFLEX Take 1 tablet (4 mg total) by mouth every 8 (eight) hours as needed.   triamcinolone 55 MCG/ACT Aero nasal inhaler Commonly known as:  NASACORT Place 2 sprays into the nose daily.   valACYclovir 1000 MG tablet Commonly known as:  VALTREX Take 1 tablet (1,000 mg total) by mouth 2 (two)  times daily. Then one daily as maintenance          Objective:    BP 122/85   Pulse 83   Temp 97.9 F (36.6 C) (Oral)   Ht 5' 10"  (1.778 m)   Wt 242 lb 12.8 oz (110.1 kg)   BMI 34.84 kg/m   No Known Allergies  Wt Readings from Last 3 Encounters:  07/29/17 242 lb 12.8 oz (110.1 kg)  04/27/17 243 lb (110.2 kg)  01/26/17 243 lb 6.4 oz (110.4 kg)    Physical Exam  Constitutional: He appears well-developed and well-nourished. No distress.  HENT:  Head: Normocephalic and atraumatic.  Eyes: Pupils are equal, round, and reactive to light. Conjunctivae and EOM are normal.  Cardiovascular: Normal rate, regular rhythm and normal heart sounds.  Pulmonary/Chest: Effort normal and breath sounds normal. No respiratory distress.  Skin: Skin is warm and dry.  Psychiatric: He has a normal mood and affect. His behavior is normal.  Nursing note and vitals reviewed.   Results for orders placed or performed in visit on 04/24/16  CMP14+EGFR  Result Value Ref Range   Glucose 88 65 - 99 mg/dL   BUN 11 6 - 24 mg/dL   Creatinine, Ser 0.86 0.76 - 1.27 mg/dL   GFR calc non Af Amer 102 >59 mL/min/1.73   GFR calc Af Amer 118 >59 mL/min/1.73   BUN/Creatinine Ratio 13 9 - 20   Sodium 141 134 - 144 mmol/L   Potassium 4.2 3.5 - 5.2 mmol/L   Chloride 101 96 - 106 mmol/L   CO2 24 18 - 29 mmol/L   Calcium 9.3 8.7 - 10.2 mg/dL   Total Protein 6.1 6.0 - 8.5 g/dL   Albumin 4.0 3.5 - 5.5 g/dL   Globulin, Total 2.1 1.5 - 4.5 g/dL   Albumin/Globulin Ratio 1.9 1.2 - 2.2   Bilirubin Total 0.4 0.0 - 1.2 mg/dL   Alkaline Phosphatase 63 39 - 117 IU/L   AST 39 0 - 40 IU/L   ALT 46 (H) 0 - 44 IU/L  Lipid panel  Result Value Ref Range   Cholesterol, Total 132 100 - 199 mg/dL   Triglycerides 76 0 - 149 mg/dL   HDL 58 >39 mg/dL   VLDL Cholesterol Cal 15 5 - 40 mg/dL   LDL Calculated 59 0 - 99 mg/dL   Chol/HDL Ratio 2.3 0.0 - 5.0 ratio units  CBC with Differential/Platelet  Result Value Ref Range   WBC  6.2 3.4 - 10.8 x10E3/uL   RBC 4.03 (L) 4.14 - 5.80 x10E6/uL   Hemoglobin 12.7 (L) 13.0 - 17.7 g/dL   Hematocrit 37.6 37.5 - 51.0 %   MCV 93 79 - 97 fL   MCH 31.5 26.6 - 33.0 pg   MCHC 33.8 31.5 - 35.7 g/dL   RDW 13.8 12.3 - 15.4 %   Platelets 326 150 -  379 x10E3/uL   Neutrophils 48 Not Estab. %   Lymphs 33 Not Estab. %   Monocytes 12 Not Estab. %   Eos 6 Not Estab. %   Basos 1 Not Estab. %   Neutrophils Absolute 3.0 1.4 - 7.0 x10E3/uL   Lymphocytes Absolute 2.0 0.7 - 3.1 x10E3/uL   Monocytes Absolute 0.7 0.1 - 0.9 x10E3/uL   EOS (ABSOLUTE) 0.4 0.0 - 0.4 x10E3/uL   Basophils Absolute 0.0 0.0 - 0.2 x10E3/uL   Immature Granulocytes 0 Not Estab. %   Immature Grans (Abs) 0.0 0.0 - 0.1 x10E3/uL      Assessment & Plan:   1. Closed displaced fracture of third cervical vertebra, unspecified fracture morphology, sequela - oxyCODONE (ROXICODONE) 15 MG immediate release tablet; Take 1-2 tablets 4 times a day as needed.  Dispense: 240 tablet; Refill: 0 - oxyCODONE (ROXICODONE) 15 MG immediate release tablet; Take 1-2 tablets (15-30 mg total) by mouth every 6 (six) hours as needed for pain.  Dispense: 240 tablet; Refill: 0 - oxyCODONE (ROXICODONE) 15 MG immediate release tablet; Take 1-2 tablets (15-30 mg total) by mouth every 6 (six) hours as needed for pain.  Dispense: 240 tablet; Refill: 0  2. Brachial plexopathy - oxyCODONE (ROXICODONE) 15 MG immediate release tablet; Take 1-2 tablets 4 times a day as needed.  Dispense: 240 tablet; Refill: 0 - oxyCODONE (ROXICODONE) 15 MG immediate release tablet; Take 1-2 tablets (15-30 mg total) by mouth every 6 (six) hours as needed for pain.  Dispense: 240 tablet; Refill: 0 - oxyCODONE (ROXICODONE) 15 MG immediate release tablet; Take 1-2 tablets (15-30 mg total) by mouth every 6 (six) hours as needed for pain.  Dispense: 240 tablet; Refill: 0 - tiZANidine (ZANAFLEX) 4 MG tablet; Take 1 tablet (4 mg total) by mouth every 8 (eight) hours as needed.   Dispense: 90 tablet; Refill: 2  3. Injury of left peroneal nerve, initial encounter - oxyCODONE (ROXICODONE) 15 MG immediate release tablet; Take 1-2 tablets 4 times a day as needed.  Dispense: 240 tablet; Refill: 0 - oxyCODONE (ROXICODONE) 15 MG immediate release tablet; Take 1-2 tablets (15-30 mg total) by mouth every 6 (six) hours as needed for pain.  Dispense: 240 tablet; Refill: 0 - oxyCODONE (ROXICODONE) 15 MG immediate release tablet; Take 1-2 tablets (15-30 mg total) by mouth every 6 (six) hours as needed for pain.  Dispense: 240 tablet; Refill: 0 - tiZANidine (ZANAFLEX) 4 MG tablet; Take 1 tablet (4 mg total) by mouth every 8 (eight) hours as needed.  Dispense: 90 tablet; Refill: 2  4. Primary osteoarthritis involving multiple joints - oxyCODONE (ROXICODONE) 15 MG immediate release tablet; Take 1-2 tablets 4 times a day as needed.  Dispense: 240 tablet; Refill: 0 - oxyCODONE (ROXICODONE) 15 MG immediate release tablet; Take 1-2 tablets (15-30 mg total) by mouth every 6 (six) hours as needed for pain.  Dispense: 240 tablet; Refill: 0 - oxyCODONE (ROXICODONE) 15 MG immediate release tablet; Take 1-2 tablets (15-30 mg total) by mouth every 6 (six) hours as needed for pain.  Dispense: 240 tablet; Refill: 0 - meloxicam (MOBIC) 15 MG tablet; Take 1 tablet (15 mg total) by mouth daily.  Dispense: 30 tablet; Refill: 11  5. Mixed hyperlipidemia - CMP14+EGFR - Lipid panel - simvastatin (ZOCOR) 20 MG tablet; Take 1 tablet (20 mg total) by mouth daily.  Dispense: 90 tablet; Refill: 3  6. Well adult exam - CBC with Differential/Platelet - CMP14+EGFR - Lipid panel - TSH  7. Other fatigue - CBC  with Differential/Platelet - TSH   Continue all other maintenance medications as listed above.  Follow up plan: Return in about 3 months (around 10/29/2017).  Educational handout given for [place] PA-C [place] [address]  [place], [place]  [address] [phone]   07/29/2017, 10:46 PM

## 2017-07-30 LAB — LIPID PANEL
CHOL/HDL RATIO: 2.7 ratio (ref 0.0–5.0)
Cholesterol, Total: 133 mg/dL (ref 100–199)
HDL: 49 mg/dL (ref >39)
LDL Calculated: 61 mg/dL (ref 0–99)
Triglycerides: 114 mg/dL (ref 0–149)
VLDL CHOLESTEROL CAL: 23 mg/dL (ref 5–40)

## 2017-07-30 LAB — CBC WITH DIFFERENTIAL/PLATELET
Basophils Absolute: 0.1 10*3/uL (ref 0.0–0.2)
Basos: 1 %
EOS (ABSOLUTE): 0.4 10*3/uL (ref 0.0–0.4)
EOS: 4 %
HEMATOCRIT: 42.2 % (ref 37.5–51.0)
HEMOGLOBIN: 14.7 g/dL (ref 13.0–17.7)
IMMATURE GRANS (ABS): 0 10*3/uL (ref 0.0–0.1)
IMMATURE GRANULOCYTES: 1 %
LYMPHS ABS: 2.8 10*3/uL (ref 0.7–3.1)
Lymphs: 33 %
MCH: 33.2 pg — ABNORMAL HIGH (ref 26.6–33.0)
MCHC: 34.8 g/dL (ref 31.5–35.7)
MCV: 95 fL (ref 79–97)
MONOCYTES: 10 %
Monocytes Absolute: 0.9 10*3/uL (ref 0.1–0.9)
Neutrophils Absolute: 4.4 10*3/uL (ref 1.4–7.0)
Neutrophils: 51 %
Platelets: 391 10*3/uL (ref 150–450)
RBC: 4.43 x10E6/uL (ref 4.14–5.80)
RDW: 12.4 % (ref 12.3–15.4)
WBC: 8.6 10*3/uL (ref 3.4–10.8)

## 2017-07-30 LAB — CMP14+EGFR
ALBUMIN: 4.7 g/dL (ref 3.5–5.5)
ALT: 18 IU/L (ref 0–44)
AST: 19 IU/L (ref 0–40)
Albumin/Globulin Ratio: 2.1 (ref 1.2–2.2)
Alkaline Phosphatase: 68 IU/L (ref 39–117)
BUN / CREAT RATIO: 15 (ref 9–20)
BUN: 15 mg/dL (ref 6–24)
Bilirubin Total: 0.4 mg/dL (ref 0.0–1.2)
CALCIUM: 10 mg/dL (ref 8.7–10.2)
CO2: 26 mmol/L (ref 20–29)
CREATININE: 0.97 mg/dL (ref 0.76–1.27)
Chloride: 102 mmol/L (ref 96–106)
GFR calc Af Amer: 105 mL/min/{1.73_m2} (ref >59)
GFR, EST NON AFRICAN AMERICAN: 91 mL/min/{1.73_m2} (ref >59)
GLOBULIN, TOTAL: 2.2 g/dL (ref 1.5–4.5)
Glucose: 100 mg/dL — ABNORMAL HIGH (ref 65–99)
Potassium: 4 mmol/L (ref 3.5–5.2)
SODIUM: 144 mmol/L (ref 134–144)
TOTAL PROTEIN: 6.9 g/dL (ref 6.0–8.5)

## 2017-07-30 LAB — TSH: TSH: 2.27 u[IU]/mL (ref 0.450–4.500)

## 2017-08-11 ENCOUNTER — Other Ambulatory Visit: Payer: Self-pay | Admitting: Physician Assistant

## 2017-08-11 ENCOUNTER — Ambulatory Visit: Payer: Medicare Other

## 2017-08-11 DIAGNOSIS — G540 Brachial plexus disorders: Secondary | ICD-10-CM

## 2017-08-11 DIAGNOSIS — S8412XA Injury of peroneal nerve at lower leg level, left leg, initial encounter: Secondary | ICD-10-CM

## 2017-09-03 ENCOUNTER — Telehealth: Payer: Self-pay | Admitting: Physician Assistant

## 2017-09-03 ENCOUNTER — Other Ambulatory Visit: Payer: Self-pay | Admitting: Physician Assistant

## 2017-09-03 DIAGNOSIS — J309 Allergic rhinitis, unspecified: Secondary | ICD-10-CM

## 2017-09-10 ENCOUNTER — Other Ambulatory Visit: Payer: Self-pay | Admitting: Physician Assistant

## 2017-09-10 DIAGNOSIS — G540 Brachial plexus disorders: Secondary | ICD-10-CM

## 2017-09-10 DIAGNOSIS — S8412XA Injury of peroneal nerve at lower leg level, left leg, initial encounter: Secondary | ICD-10-CM

## 2017-09-10 NOTE — Telephone Encounter (Signed)
Last seen 07/29/17  Keith Robertson

## 2017-09-11 ENCOUNTER — Other Ambulatory Visit: Payer: Self-pay | Admitting: Physician Assistant

## 2017-09-11 DIAGNOSIS — S8412XA Injury of peroneal nerve at lower leg level, left leg, initial encounter: Secondary | ICD-10-CM

## 2017-09-11 DIAGNOSIS — M159 Polyosteoarthritis, unspecified: Secondary | ICD-10-CM

## 2017-09-11 DIAGNOSIS — S12200S Unspecified displaced fracture of third cervical vertebra, sequela: Secondary | ICD-10-CM

## 2017-09-11 DIAGNOSIS — G540 Brachial plexus disorders: Secondary | ICD-10-CM

## 2017-09-11 DIAGNOSIS — M150 Primary generalized (osteo)arthritis: Secondary | ICD-10-CM

## 2017-09-13 ENCOUNTER — Other Ambulatory Visit: Payer: Self-pay | Admitting: Physician Assistant

## 2017-09-13 DIAGNOSIS — M159 Polyosteoarthritis, unspecified: Secondary | ICD-10-CM

## 2017-09-13 DIAGNOSIS — G540 Brachial plexus disorders: Secondary | ICD-10-CM

## 2017-09-13 DIAGNOSIS — S8412XA Injury of peroneal nerve at lower leg level, left leg, initial encounter: Secondary | ICD-10-CM

## 2017-09-13 DIAGNOSIS — E782 Mixed hyperlipidemia: Secondary | ICD-10-CM

## 2017-09-13 DIAGNOSIS — M150 Primary generalized (osteo)arthritis: Secondary | ICD-10-CM

## 2017-09-13 DIAGNOSIS — S12200S Unspecified displaced fracture of third cervical vertebra, sequela: Secondary | ICD-10-CM

## 2017-09-14 ENCOUNTER — Telehealth: Payer: Self-pay | Admitting: Physician Assistant

## 2017-09-14 MED ORDER — HYDROCHLOROTHIAZIDE 25 MG PO TABS: 25.0000 mg | ORAL_TABLET | Freq: Every day | ORAL | 0 refills | Status: DC

## 2017-09-14 MED ORDER — OXYCODONE HCL 15 MG PO TABS: 15.0000 mg | ORAL_TABLET | Freq: Four times a day (QID) | ORAL | 0 refills | Status: DC | PRN

## 2017-09-14 MED ORDER — TIZANIDINE HCL 4 MG PO TABS: 4.0000 mg | ORAL_TABLET | Freq: Three times a day (TID) | ORAL | 0 refills | Status: DC | PRN

## 2017-09-14 NOTE — Telephone Encounter (Signed)
Patient aware that he will not be seeing [facility] this day and [facility]

## 2017-09-21 ENCOUNTER — Ambulatory Visit: Payer: Medicare Other | Admitting: *Deleted

## 2017-09-21 VITALS — BP 113/82 | HR 87 | Ht 70.0 in | Wt 244.2 lb

## 2017-09-21 DIAGNOSIS — Z0000 Encounter for general adult medical examination without abnormal findings: Secondary | ICD-10-CM | POA: Diagnosis not present

## 2017-09-21 DIAGNOSIS — Z1211 Encounter for screening for malignant neoplasm of colon: Secondary | ICD-10-CM

## 2017-09-21 DIAGNOSIS — Z23 Encounter for immunization: Secondary | ICD-10-CM

## 2017-09-21 NOTE — Patient Instructions (Signed)
  Mr. Keith Robertson , Thank you for taking time to come for your Medicare Wellness Visit. I appreciate your ongoing commitment to your health goals. Please review the following plan we discussed and let me know if I can assist you in the future.   These are the goals we discussed: Goals    . Exercise 3x per week (30 min per time)       This is a list of the screening recommended for you and due dates:  Health Maintenance  Topic Date Due  . Flu Shot  09/10/2017  . Colon Cancer Screening  09/22/2018*  . HIV Screening  09/22/2018*  . Tetanus Vaccine  09/22/2027  *Topic was postponed. The date shown is not the original due date.

## 2017-09-21 NOTE — Progress Notes (Signed)
Subjective:   Keith Robertson is a 52 y.o. male who presents for an Initial Medicare Annual Wellness Visit. Keith Robertson worked several places before becoming disabled. He lives at home with his wife. They have 5 adlut children. They do not have stairs, pets, or rugs in the home. Patient does have dogs outside. Fall hazards discussed with patient.    Review of Systems  Patient states that there health is the about the same as it was last year at this time   Cardiac Risk Factors include: advanced age (>3255men, 57>65 women);dyslipidemia;obesity (BMI >30kg/m2);male gender    Objective:    Today's Vitals   09/21/17 1515  BP: 113/82  Pulse: 87  Weight: 244 lb 3.2 oz (110.8 kg)  Height: 5\' 10"  (1.778 m)   Body mass index is 35.04 kg/m.  Advanced Directives 09/21/2017  Does Patient Have a Medical Advance Directive? No  Would patient like information on creating a medical advance directive? Yes (MAU/Ambulatory/Procedural Areas - Information given)    Current Medications (verified) Outpatient Encounter Medications as of 09/21/2017  Medication Sig  . acetaminophen-codeine (TYLENOL #3) 300-30 MG tablet Take 2 tablets by mouth every 8 (eight) hours as needed for moderate pain (HEADACHE).  Marland Kitchen. albuterol (PROAIR HFA) 108 (90 Base) MCG/ACT inhaler USE 2 PUFFS ONCE A DAY  . cetirizine (ZYRTEC) 10 MG tablet TAKE 1 TABLET (10 MG TOTAL) BY MOUTH DAILY.  Marland Kitchen. gabapentin (NEURONTIN) 800 MG tablet TAKE 1 TABLET BY MOUTH THREE TIMES A DAY  . hydrochlorothiazide (HYDRODIURIL) 25 MG tablet Take 1 tablet (25 mg total) by mouth daily.  . meloxicam (MOBIC) 15 MG tablet Take 1 tablet (15 mg total) by mouth daily.  Marland Kitchen. oxyCODONE (ROXICODONE) 15 MG immediate release tablet Take 1-2 tablets (15-30 mg total) by mouth every 6 (six) hours as needed for pain.  Marland Kitchen. oxyCODONE (ROXICODONE) 15 MG immediate release tablet Take 1-2 tablets (15-30 mg total) by mouth every 6 (six) hours as needed for pain.  . simvastatin (ZOCOR) 20 MG  tablet Take 1 tablet (20 mg total) by mouth daily.  . SYMBICORT 80-4.5 MCG/ACT inhaler USE 2 PUFFS TWICE A DAY 30 DAYS INHALATION 30 DAYS  . tiZANidine (ZANAFLEX) 4 MG tablet Take 1 tablet (4 mg total) by mouth every 8 (eight) hours as needed.  . triamcinolone (NASACORT) 55 MCG/ACT AERO nasal inhaler Place 2 sprays into the nose daily.  . valACYclovir (VALTREX) 1000 MG tablet Take 1 tablet (1,000 mg total) by mouth 2 (two) times daily. Then one daily as maintenance  . [DISCONTINUED] tiZANidine (ZANAFLEX) 4 MG tablet Take 1 tablet (4 mg total) by mouth every 8 (eight) hours as needed.   No facility-administered encounter medications on file as of 09/21/2017.     Allergies (verified) Patient has no known allergies.   History: Past Medical History:  Diagnosis Date  . Broken femur (HCC)   . Broken leg   . Broken neck (HCC)   . Hypertension   . Muscle, jerky movements (uncontrolled)    Past Surgical History:  Procedure Laterality Date  . ANKLE SURGERY Right   . BACK SURGERY    . KNEE SURGERY Left   . LEG SURGERY Left    Family History  Problem Relation Age of Onset  . Heart disease Mother   . Diabetes Mother   . Hypertension Mother   . Hyperlipidemia Brother   . Diabetes Brother   . Heart disease Brother   . Heart attack Brother   . SIDS  Brother    Social History   Socioeconomic History  . Marital status: Married    Spouse name: Candy   . Number of children: 5  . Years of education: 5  . Highest education level: 5th grade  Occupational History  . Not on file  Social Needs  . Financial resource strain: Not hard at all  . Food insecurity:    Worry: Never true    Inability: Never true  . Transportation needs:    Medical: No    Non-medical: No  Tobacco Use  . Smoking status: Never Smoker  . Smokeless tobacco: Current User    Types: Chew  Substance and Sexual Activity  . Alcohol use: No  . Drug use: No  . Sexual activity: Yes  Lifestyle  . Physical activity:     Days per week: 3 days    Minutes per session: 30 min  . Stress: Not at all  Relationships  . Social connections:    Talks on phone: More than three times a week    Gets together: Once a week    Attends religious service: Never    Active member of club or organization: No    Attends meetings of clubs or organizations: Never    Relationship status: Married  Other Topics Concern  . Not on file  Social History Narrative  . Not on file   Tobacco Counseling Ready to quit: Not Answered Counseling given: Not Answered Patient does not smoke but does currently dip   Activities of Daily Living In your present state of health, do you have any difficulty performing the following activities: 09/21/2017  Hearing? N  Vision? Y  Comment wears glasses at times   Difficulty concentrating or making decisions? Y  Comment Trouble remembering   Walking or climbing stairs? Y  Comment Due to several surgeris on legs, knees and ankle  Dressing or bathing? Y  Comment Due to leg, knees, and ankles   Doing errands, shopping? 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 N  Using the Toilet? N  In the past six months, have you accidently leaked urine? N  Do you have problems with loss of bowel control? N  Managing your Medications? N  Managing your Finances? N  Housekeeping or managing your Housekeeping? N  Some recent data might be hidden     Immunizations and Health Maintenance Immunization History  Administered Date(s) Administered  . Influenza,inj,Quad PF,6+ Mos 01/18/2016, 11/20/2016  . Tdap 09/21/2017   Health Maintenance Due  Topic Date Due  . INFLUENZA VACCINE  09/10/2017    Patient Care Team: Caryl NeverJones, Angel S, PA-C as PCP - General (Physician Assistant)  Indicate any recent Medical Services you may have received from other than [facility] providers in the past year (date may be approximate).    Assessment:   This is a routine wellness examination for  Keith Robertson.  Hearing/Vision screen No exam data present  Dietary issues and exercise activities discussed: Current Exercise Habits: Home exercise routine, Type of exercise: walking, Time (Minutes): 30, Frequency (Times/Week): 3, Weekly Exercise (Minutes/Week): 90, Intensity: Mild, Exercise limited by: orthopedic condition(s)  Goals    . Exercise 3x per week (30 min per time)      Depression Screen PHQ 2/9 Scores 09/21/2017 07/29/2017 04/27/2017 01/26/2017  PHQ - 2 Score 0 0 1 0  PHQ- 9 Score - - - -    Fall Risk Fall Risk  09/21/2017 04/27/2017 04/16/2016 10/18/2015  Falls in the past year? No No No No    Is the patient's home free of loose throw rugs in walkways, pet beds, electrical cords, etc?   yes      Grab bars in the bathroom? no      Handrails on the stairs?   no stairs in the home      Adequate lighting?   yes  Timed Get Up and Go performed:   Cognitive Function: MMSE - Mini Mental State Exam 09/21/2017  Orientation to time 5  Orientation to Place 5  Registration 3  Attention/ Calculation 0  Attention/Calculation-comments unable to read and write  Recall 3  Language- name 2 objects 2  Language- repeat 1  Language- follow 3 step command 3  Language- read & follow direction 0  Language-read & follow direction-comments Unable to read and write   Write a sentence 0  Write a sentence-comments unable to read and write  Copy design 1  Total score 23        Screening Tests Health Maintenance  Topic Date Due  . INFLUENZA VACCINE  09/10/2017  . COLONOSCOPY  09/22/2018 (Originally 01/12/2017)  . HIV Screening  09/22/2018 (Originally 01/12/1982)  . TETANUS/TDAP  09/22/2027    Qualifies for Shingles Vaccine? Np   Cancer Screenings: Lung: Low Dose CT Chest recommended if Age 21-80 years, 30 pack-year currently smoking OR have quit w/in 15years. Patient does not qualify. Colorectal: FOBT given patient refuses colonoscopy   Additional Screenings:  Hepatitis C Screening:    HIV screening: Patient will have at next visit.      Plan:  Patient will follow up with Lawanna Kobus as planned Patient will have flu vaccine in October Patient is going to schedule an eye exam     I have personally reviewed and noted the following in the patient's chart:   . Medical and social history . Use of alcohol, tobacco or illicit drugs  . Current medications and supplements . Functional ability and status . Nutritional status . Physical activity . Advanced directives . List of other physicians . Hospitalizations, surgeries, and ER visits in previous 12 months . Vitals . Screenings to include cognitive, depression, and falls . Referrals and appointments  In addition, I have reviewed and discussed with patient certain preventive protocols, quality metrics, and best practice recommendations. A written personalized care plan for preventive services as well as general preventive health recommendations were provided to patient.     Sherron Monday, LPN   [id number]

## 2017-09-29 ENCOUNTER — Ambulatory Visit (INDEPENDENT_AMBULATORY_CARE_PROVIDER_SITE_OTHER): Payer: Medicare Other | Admitting: Physician Assistant

## 2017-09-29 ENCOUNTER — Telehealth: Payer: Self-pay | Admitting: Physician Assistant

## 2017-09-29 ENCOUNTER — Encounter: Payer: Self-pay | Admitting: Physician Assistant

## 2017-09-29 DIAGNOSIS — S12200S Unspecified displaced fracture of third cervical vertebra, sequela: Secondary | ICD-10-CM | POA: Diagnosis not present

## 2017-09-29 DIAGNOSIS — S8412XA Injury of peroneal nerve at lower leg level, left leg, initial encounter: Secondary | ICD-10-CM

## 2017-09-29 DIAGNOSIS — G540 Brachial plexus disorders: Secondary | ICD-10-CM | POA: Diagnosis not present

## 2017-09-29 DIAGNOSIS — M150 Primary generalized (osteo)arthritis: Secondary | ICD-10-CM | POA: Diagnosis not present

## 2017-09-29 DIAGNOSIS — Z1211 Encounter for screening for malignant neoplasm of colon: Secondary | ICD-10-CM

## 2017-09-29 DIAGNOSIS — M159 Polyosteoarthritis, unspecified: Secondary | ICD-10-CM

## 2017-09-29 MED ORDER — OXYCODONE HCL 15 MG PO TABS: 15.0000 mg | ORAL_TABLET | Freq: Four times a day (QID) | ORAL | 0 refills | Status: DC | PRN

## 2017-09-29 MED ORDER — OMEPRAZOLE 20 MG PO CPDR: 20.0000 mg | DELAYED_RELEASE_CAPSULE | Freq: Two times a day (BID) | ORAL | 11 refills | Status: DC

## 2017-09-30 NOTE — Progress Notes (Signed)
BP 126/83   Pulse (!) 116   Temp 97.9 F (36.6 C) (Oral)   Ht _0  (1.778 m)   Wt 239 lb 9.6 oz (108.7 kg)   BMI 34.38 kg/m    Subjective:    Patient ID: Keith Robertson, male    DOB: [birth year], 52 y.o.   MRN: [id number]  HPI: [name] is a 52 y.o. male presenting on 09/29/2017 for Medical Management of Chronic Issues; Hyperlipidemia; and chronic pain follow up  Chronic pain is under control, patient has no new complaints. Medications are keeping things stable. Needs refills for the next three months.   Controlled Substance website checked and normal. Drug screen normal this year.   Past Medical History:  Diagnosis Date  . Broken femur ([place])   . Broken leg   . Broken neck ([place])   . Hypertension   . Muscle, jerky movements (uncontrolled)    Relevant past medical, surgical, family and social history reviewed and updated as indicated. Interim medical history since our last visit reviewed. Allergies and medications reviewed and updated. DATA REVIEWED: CHART IN EPIC  Family History reviewed for pertinent findings.  Review of Systems  Constitutional: Negative.  Negative for appetite change and fatigue.  Eyes: Negative for pain and visual disturbance.  Respiratory: Negative.  Negative for cough, chest tightness, shortness of breath and wheezing.   Cardiovascular: Negative.  Negative for chest pain, palpitations and leg swelling.  Gastrointestinal: Negative.  Negative for abdominal pain, diarrhea, nausea and vomiting.  Genitourinary: Negative.   Skin: Negative.  Negative for color change and rash.  Neurological: Negative.  Negative for weakness, numbness and headaches.  Psychiatric/Behavioral: Negative.     Allergies as of 09/29/2017   No Known Allergies     Medication List        Accurate as of 09/29/17 11:59 PM. Always use your most recent med list.          acetaminophen-codeine 300-30 MG tablet Commonly known as:  TYLENOL #3 Take 2 tablets by mouth every  8 (eight) hours as needed for moderate pain (HEADACHE).   cetirizine 10 MG tablet Commonly known as:  ZYRTEC TAKE 1 TABLET (10 MG TOTAL) BY MOUTH DAILY.   gabapentin 800 MG tablet Commonly known as:  NEURONTIN TAKE 1 TABLET BY MOUTH THREE TIMES A DAY   hydrochlorothiazide 25 MG tablet Commonly known as:  HYDRODIURIL Take 1 tablet (25 mg total) by mouth daily.   meloxicam 15 MG tablet Commonly known as:  MOBIC Take 1 tablet (15 mg total) by mouth daily.   omeprazole 20 MG capsule Commonly known as:  PRILOSEC Take 1 capsule (20 mg total) by mouth 2 (two) times daily.   oxyCODONE 15 MG immediate release tablet Commonly known as:  ROXICODONE Take 1-2 tablets (15-30 mg total) by mouth every 6 (six) hours as needed for pain.   oxyCODONE 15 MG immediate release tablet Commonly known as:  ROXICODONE Take 1-2 tablets (15-30 mg total) by mouth every 6 (six) hours as needed for pain.   oxyCODONE 15 MG immediate release tablet Commonly known as:  ROXICODONE Take 1-2 tablets (15-30 mg total) by mouth every 6 (six) hours as needed for pain.   PROAIR HFA 108 (90 Base) MCG/ACT inhaler Generic drug:  albuterol USE 2 PUFFS ONCE A DAY   simvastatin 20 MG tablet Commonly known as:  ZOCOR Take 1 tablet (20 mg total) by mouth daily.   SYMBICORT 80-4.5 MCG/ACT inhaler Generic drug:  budesonide-formoterol  USE 2 PUFFS TWICE A DAY 30 DAYS INHALATION 30 DAYS   tiZANidine 4 MG tablet Commonly known as:  ZANAFLEX Take 1 tablet (4 mg total) by mouth every 8 (eight) hours as needed.   triamcinolone 55 MCG/ACT Aero nasal inhaler Commonly known as:  NASACORT Place 2 sprays into the nose daily.   valACYclovir 1000 MG tablet Commonly known as:  VALTREX Take 1 tablet (1,000 mg total) by mouth 2 (two) times daily. Then one daily as maintenance          Objective:    BP 126/83   Pulse (!) 116   Temp 97.9 F (36.6 C) (Oral)   Ht _0  (1.778 m)   Wt 239 lb 9.6 oz (108.7 kg)   BMI  34.38 kg/m   No Known Allergies  Wt Readings from Last 3 Encounters:  09/29/17 239 lb 9.6 oz (108.7 kg)  09/21/17 244 lb 3.2 oz (110.8 kg)  07/29/17 242 lb 12.8 oz (110.1 kg)    Physical Exam  Constitutional: He appears well-developed and well-nourished. No distress.  HENT:  Head: Normocephalic and atraumatic.  Eyes: Pupils are equal, round, and reactive to light. Conjunctivae and EOM are normal.  Cardiovascular: Normal rate, regular rhythm and normal heart sounds.  Pulmonary/Chest: Effort normal and breath sounds normal. No respiratory distress.  Skin: Skin is warm and dry.  Psychiatric: He has a normal mood and affect. His behavior is normal.  Nursing note and vitals reviewed.   Results for orders placed or performed in visit on 07/29/17  CBC with Differential/Platelet  Result Value Ref Range   WBC 8.6 3.4 - 10.8 x10E3/uL   RBC 4.43 4.14 - 5.80 x10E6/uL   Hemoglobin 14.7 13.0 - 17.7 g/dL   Hematocrit 42.2 37.5 - 51.0 %   MCV 95 79 - 97 fL   MCH 33.2 (H) 26.6 - 33.0 pg   MCHC 34.8 31.5 - 35.7 g/dL   RDW 12.4 12.3 - 15.4 %   Platelets 391 150 - 450 x10E3/uL   Neutrophils 51 Not Estab. %   Lymphs 33 Not Estab. %   Monocytes 10 Not Estab. %   Eos 4 Not Estab. %   Basos 1 Not Estab. %   Neutrophils Absolute 4.4 1.4 - 7.0 x10E3/uL   Lymphocytes Absolute 2.8 0.7 - 3.1 x10E3/uL   Monocytes Absolute 0.9 0.1 - 0.9 x10E3/uL   EOS (ABSOLUTE) 0.4 0.0 - 0.4 x10E3/uL   Basophils Absolute 0.1 0.0 - 0.2 x10E3/uL   Immature Granulocytes 1 Not Estab. %   Immature Grans (Abs) 0.0 0.0 - 0.1 x10E3/uL  CMP14+EGFR  Result Value Ref Range   Glucose 100 (H) 65 - 99 mg/dL   BUN 15 6 - 24 mg/dL   Creatinine, Ser 0.97 0.76 - 1.27 mg/dL   GFR calc non Af Amer 91 >59 mL/min/1.73   GFR calc Af Amer 105 >59 mL/min/1.73   BUN/Creatinine Ratio 15 9 - 20   Sodium 144 134 - 144 mmol/L   Potassium 4.0 3.5 - 5.2 mmol/L   Chloride 102 96 - 106 mmol/L   CO2 26 20 - 29 mmol/L   Calcium 10.0 8.7 -  10.2 mg/dL   Total Protein 6.9 6.0 - 8.5 g/dL   Albumin 4.7 3.5 - 5.5 g/dL   Globulin, Total 2.2 1.5 - 4.5 g/dL   Albumin/Globulin Ratio 2.1 1.2 - 2.2   Bilirubin Total 0.4 0.0 - 1.2 mg/dL   Alkaline Phosphatase 68 39 - 117 IU/L  AST 19 0 - 40 IU/L   ALT 18 0 - 44 IU/L  Lipid panel  Result Value Ref Range   Cholesterol, Total 133 100 - 199 mg/dL   Triglycerides 114 0 - 149 mg/dL   HDL 49 >39 mg/dL   VLDL Cholesterol Cal 23 5 - 40 mg/dL   LDL Calculated 61 0 - 99 mg/dL   Chol/HDL Ratio 2.7 0.0 - 5.0 ratio  TSH  Result Value Ref Range   TSH 2.270 0.450 - 4.500 uIU/mL      Assessment & Plan:   1. Closed displaced fracture of third cervical vertebra, unspecified fracture morphology, sequela - oxyCODONE (ROXICODONE) 15 MG immediate release tablet; Take 1-2 tablets (15-30 mg total) by mouth every 6 (six) hours as needed for pain.  Dispense: 240 tablet; Refill: 0 - oxyCODONE (ROXICODONE) 15 MG immediate release tablet; Take 1-2 tablets (15-30 mg total) by mouth every 6 (six) hours as needed for pain.  Dispense: 240 tablet; Refill: 0 - oxyCODONE (ROXICODONE) 15 MG immediate release tablet; Take 1-2 tablets (15-30 mg total) by mouth every 6 (six) hours as needed for pain.  Dispense: 240 tablet; Refill: 0  2. Brachial plexopathy - oxyCODONE (ROXICODONE) 15 MG immediate release tablet; Take 1-2 tablets (15-30 mg total) by mouth every 6 (six) hours as needed for pain.  Dispense: 240 tablet; Refill: 0 - oxyCODONE (ROXICODONE) 15 MG immediate release tablet; Take 1-2 tablets (15-30 mg total) by mouth every 6 (six) hours as needed for pain.  Dispense: 240 tablet; Refill: 0 - oxyCODONE (ROXICODONE) 15 MG immediate release tablet; Take 1-2 tablets (15-30 mg total) by mouth every 6 (six) hours as needed for pain.  Dispense: 240 tablet; Refill: 0  3. Injury of left peroneal nerve, initial encounter - oxyCODONE (ROXICODONE) 15 MG immediate release tablet; Take 1-2 tablets (15-30 mg total) by mouth  every 6 (six) hours as needed for pain.  Dispense: 240 tablet; Refill: 0 - oxyCODONE (ROXICODONE) 15 MG immediate release tablet; Take 1-2 tablets (15-30 mg total) by mouth every 6 (six) hours as needed for pain.  Dispense: 240 tablet; Refill: 0 - oxyCODONE (ROXICODONE) 15 MG immediate release tablet; Take 1-2 tablets (15-30 mg total) by mouth every 6 (six) hours as needed for pain.  Dispense: 240 tablet; Refill: 0  4. Primary osteoarthritis involving multiple joints - oxyCODONE (ROXICODONE) 15 MG immediate release tablet; Take 1-2 tablets (15-30 mg total) by mouth every 6 (six) hours as needed for pain.  Dispense: 240 tablet; Refill: 0 - oxyCODONE (ROXICODONE) 15 MG immediate release tablet; Take 1-2 tablets (15-30 mg total) by mouth every 6 (six) hours as needed for pain.  Dispense: 240 tablet; Refill: 0 - oxyCODONE (ROXICODONE) 15 MG immediate release tablet; Take 1-2 tablets (15-30 mg total) by mouth every 6 (six) hours as needed for pain.  Dispense: 240 tablet; Refill: 0  5. Screen for colon cancer - Fecal occult blood, imunochemical   Continue all other maintenance medications as listed above.  Follow up plan: Return in about 3 months (around 12/30/2017) for recheck, cancel next month.  Educational handout given for [place] PA-C [place] [address]  [place], [place] [address] [phone]   09/30/2017, 10:48 PM

## 2017-10-02 LAB — FECAL OCCULT BLOOD, IMMUNOCHEMICAL: Fecal Occult Bld: NEGATIVE

## 2017-10-18 ENCOUNTER — Other Ambulatory Visit: Payer: Self-pay | Admitting: Physician Assistant

## 2017-10-18 DIAGNOSIS — E782 Mixed hyperlipidemia: Secondary | ICD-10-CM

## 2017-10-30 ENCOUNTER — Ambulatory Visit: Payer: Medicare Other | Admitting: Physician Assistant

## 2017-10-31 ENCOUNTER — Other Ambulatory Visit: Payer: Self-pay | Admitting: Physician Assistant

## 2017-10-31 DIAGNOSIS — S8412XA Injury of peroneal nerve at lower leg level, left leg, initial encounter: Secondary | ICD-10-CM

## 2017-10-31 DIAGNOSIS — G540 Brachial plexus disorders: Secondary | ICD-10-CM

## 2017-11-05 ENCOUNTER — Telehealth: Payer: Self-pay | Admitting: Physician Assistant

## 2017-11-05 NOTE — Telephone Encounter (Signed)
Pt aware that dates here are correct and that pharm can only fill every 30 days.

## 2017-11-10 ENCOUNTER — Other Ambulatory Visit: Payer: Self-pay | Admitting: Physician Assistant

## 2017-12-04 ENCOUNTER — Other Ambulatory Visit: Payer: Self-pay | Admitting: Physician Assistant

## 2017-12-04 DIAGNOSIS — B001 Herpesviral vesicular dermatitis: Secondary | ICD-10-CM

## 2017-12-06 ENCOUNTER — Other Ambulatory Visit: Payer: Self-pay | Admitting: Physician Assistant

## 2017-12-29 ENCOUNTER — Other Ambulatory Visit: Payer: Self-pay | Admitting: Physician Assistant

## 2017-12-29 DIAGNOSIS — B001 Herpesviral vesicular dermatitis: Secondary | ICD-10-CM

## 2018-01-04 ENCOUNTER — Ambulatory Visit (INDEPENDENT_AMBULATORY_CARE_PROVIDER_SITE_OTHER): Payer: Medicare Other | Admitting: Physician Assistant

## 2018-01-04 ENCOUNTER — Other Ambulatory Visit: Payer: Self-pay | Admitting: Physician Assistant

## 2018-01-04 ENCOUNTER — Encounter: Payer: Self-pay | Admitting: Physician Assistant

## 2018-01-04 DIAGNOSIS — M150 Primary generalized (osteo)arthritis: Secondary | ICD-10-CM

## 2018-01-04 DIAGNOSIS — S12200S Unspecified displaced fracture of third cervical vertebra, sequela: Secondary | ICD-10-CM | POA: Diagnosis not present

## 2018-01-04 DIAGNOSIS — M159 Polyosteoarthritis, unspecified: Secondary | ICD-10-CM

## 2018-01-04 DIAGNOSIS — Z23 Encounter for immunization: Secondary | ICD-10-CM

## 2018-01-04 DIAGNOSIS — S8412XA Injury of peroneal nerve at lower leg level, left leg, initial encounter: Secondary | ICD-10-CM | POA: Diagnosis not present

## 2018-01-04 DIAGNOSIS — E782 Mixed hyperlipidemia: Secondary | ICD-10-CM | POA: Diagnosis not present

## 2018-01-04 DIAGNOSIS — G540 Brachial plexus disorders: Secondary | ICD-10-CM | POA: Diagnosis not present

## 2018-01-04 MED ORDER — SIMVASTATIN 20 MG PO TABS: 20.0000 mg | ORAL_TABLET | Freq: Every day | ORAL | 3 refills | Status: DC

## 2018-01-04 MED ORDER — OXYCODONE HCL 15 MG PO TABS: 15.0000 mg | ORAL_TABLET | Freq: Four times a day (QID) | ORAL | 0 refills | Status: DC | PRN

## 2018-01-04 NOTE — Progress Notes (Signed)
BP 126/84   Pulse (!) 116   Ht 5\' 10"  (1.778 m)   Wt 240 lb (108.9 kg)   BMI 34.44 kg/m    Subjective:    Patient ID: Keith Robertson, male    DOB: 1965-09-19, 52 y.o.   MRN: [id number]  HPI: Keith Robertson is a 52 y.o. male presenting on 01/04/2018 for Hyperlipidemia (3 month follow up) and Pain  This patient comes in for 4-month recheck on his chronic medical conditions.  They do include chronic pain and he does chronic pain medications and muscle relaxants.  We have updated a narcotic contract and there are no red flags seen in the registry.  He has no other complaints concerning this today.  All his medications are reviewed labs are reviewed and will be ordered for future grief.  Past Medical History:  Diagnosis Date  . Broken femur (HCC)   . Broken leg   . Broken neck (HCC)   . Hypertension   . Muscle, jerky movements (uncontrolled)    Relevant past medical, surgical, family and social history reviewed and updated as indicated. Interim medical history since our last visit reviewed. Allergies and medications reviewed and updated. DATA REVIEWED: CHART IN EPIC  Family History reviewed for pertinent findings.  Review of Systems  Constitutional: Negative.  Negative for appetite change and fatigue.  Eyes: Negative for pain and visual disturbance.  Respiratory: Negative.  Negative for cough, chest tightness, shortness of breath and wheezing.   Cardiovascular: Negative.  Negative for chest pain, palpitations and leg swelling.  Gastrointestinal: Negative.  Negative for abdominal pain, diarrhea, nausea and vomiting.  Genitourinary: Negative.   Skin: Negative.  Negative for color change and rash.  Neurological: Negative.  Negative for weakness, numbness and headaches.  Psychiatric/Behavioral: Negative.     Allergies as of 01/04/2018   No Known Allergies     Medication List        Accurate as of 01/04/18 10:05 PM. Always use your most recent med list.            acetaminophen-codeine 300-30 MG tablet Commonly known as:  TYLENOL #3 Take 2 tablets by mouth every 8 (eight) hours as needed for moderate pain (HEADACHE).   cetirizine 10 MG tablet Commonly known as:  ZYRTEC TAKE 1 TABLET (10 MG TOTAL) BY MOUTH DAILY.   gabapentin 800 MG tablet Commonly known as:  NEURONTIN TAKE 1 TABLET BY MOUTH THREE TIMES A DAY   hydrochlorothiazide 25 MG tablet Commonly known as:  HYDRODIURIL TAKE 1 TABLET BY MOUTH EVERY DAY   meloxicam 15 MG tablet Commonly known as:  MOBIC Take 1 tablet (15 mg total) by mouth daily.   omeprazole 20 MG capsule Commonly known as:  PRILOSEC Take 1 capsule (20 mg total) by mouth 2 (two) times daily.   oxyCODONE 15 MG immediate release tablet Commonly known as:  ROXICODONE Take 1-2 tablets (15-30 mg total) by mouth every 6 (six) hours as needed for pain.   oxyCODONE 15 MG immediate release tablet Commonly known as:  ROXICODONE Take 1-2 tablets (15-30 mg total) by mouth every 6 (six) hours as needed for pain.   oxyCODONE 15 MG immediate release tablet Commonly known as:  ROXICODONE Take 1-2 tablets (15-30 mg total) by mouth every 6 (six) hours as needed for pain.   PROAIR HFA 108 (90 Base) MCG/ACT inhaler Generic drug:  albuterol USE 2 PUFFS ONCE A DAY   simvastatin 20 MG tablet Commonly known as:  ZOCOR Take 1 tablet (20 mg total) by mouth daily.   SYMBICORT 80-4.5 MCG/ACT inhaler Generic drug:  budesonide-formoterol USE 2 PUFFS TWICE A DAY 30 DAYS INHALATION 30 DAYS   tiZANidine 4 MG tablet Commonly known as:  ZANAFLEX TAKE 1 TABLET BY MOUTH EVERY 8 HOURS AS NEEDED   triamcinolone 55 MCG/ACT Aero nasal inhaler Commonly known as:  NASACORT Place 2 sprays into the nose daily.   valACYclovir 1000 MG tablet Commonly known as:  VALTREX TAKE 1 TABLET (1,000 MG TOTAL) BY MOUTH 2 (TWO) TIMES DAILY. THEN ONE DAILY AS MAINTENANCE          Objective:    BP 126/84   Pulse (!) 116   Ht 5\' 10"  (1.778 m)    Wt 240 lb (108.9 kg)   BMI 34.44 kg/m   No Known Allergies  Wt Readings from Last 3 Encounters:  01/04/18 240 lb (108.9 kg)  09/29/17 239 lb 9.6 oz (108.7 kg)  09/21/17 244 lb 3.2 oz (110.8 kg)    Physical Exam  Constitutional: He appears well-developed and well-nourished. No distress.  HENT:  Head: Normocephalic and atraumatic.  Eyes: Pupils are equal, round, and reactive to light. Conjunctivae and EOM are normal.  Cardiovascular: Normal rate, regular rhythm and normal heart sounds.  Pulmonary/Chest: Effort normal and breath sounds normal. No respiratory distress.  Skin: Skin is warm and dry.  Psychiatric: He has a normal mood and affect. His behavior is normal.  Nursing note and vitals reviewed.   Results for orders placed or performed in visit on 09/29/17  Fecal occult blood, imunochemical  Result Value Ref Range   Fecal Occult Bld Negative Negative      Assessment & Plan:   1. Closed displaced fracture of third cervical vertebra, unspecified fracture morphology, sequela - oxyCODONE (ROXICODONE) 15 MG immediate release tablet; Take 1-2 tablets (15-30 mg total) by mouth every 6 (six) hours as needed for pain.  Dispense: 240 tablet; Refill: 0 - oxyCODONE (ROXICODONE) 15 MG immediate release tablet; Take 1-2 tablets (15-30 mg total) by mouth every 6 (six) hours as needed for pain.  Dispense: 240 tablet; Refill: 0 - oxyCODONE (ROXICODONE) 15 MG immediate release tablet; Take 1-2 tablets (15-30 mg total) by mouth every 6 (six) hours as needed for pain.  Dispense: 240 tablet; Refill: 0  2. Brachial plexopathy - oxyCODONE (ROXICODONE) 15 MG immediate release tablet; Take 1-2 tablets (15-30 mg total) by mouth every 6 (six) hours as needed for pain.  Dispense: 240 tablet; Refill: 0 - oxyCODONE (ROXICODONE) 15 MG immediate release tablet; Take 1-2 tablets (15-30 mg total) by mouth every 6 (six) hours as needed for pain.  Dispense: 240 tablet; Refill: 0 - oxyCODONE (ROXICODONE) 15 MG  immediate release tablet; Take 1-2 tablets (15-30 mg total) by mouth every 6 (six) hours as needed for pain.  Dispense: 240 tablet; Refill: 0  3. Injury of left peroneal nerve, initial encounter - oxyCODONE (ROXICODONE) 15 MG immediate release tablet; Take 1-2 tablets (15-30 mg total) by mouth every 6 (six) hours as needed for pain.  Dispense: 240 tablet; Refill: 0 - oxyCODONE (ROXICODONE) 15 MG immediate release tablet; Take 1-2 tablets (15-30 mg total) by mouth every 6 (six) hours as needed for pain.  Dispense: 240 tablet; Refill: 0 - oxyCODONE (ROXICODONE) 15 MG immediate release tablet; Take 1-2 tablets (15-30 mg total) by mouth every 6 (six) hours as needed for pain.  Dispense: 240 tablet; Refill: 0  4. Primary osteoarthritis involving multiple joints -  oxyCODONE (ROXICODONE) 15 MG immediate release tablet; Take 1-2 tablets (15-30 mg total) by mouth every 6 (six) hours as needed for pain.  Dispense: 240 tablet; Refill: 0 - oxyCODONE (ROXICODONE) 15 MG immediate release tablet; Take 1-2 tablets (15-30 mg total) by mouth every 6 (six) hours as needed for pain.  Dispense: 240 tablet; Refill: 0 - oxyCODONE (ROXICODONE) 15 MG immediate release tablet; Take 1-2 tablets (15-30 mg total) by mouth every 6 (six) hours as needed for pain.  Dispense: 240 tablet; Refill: 0  5. Mixed hyperlipidemia - simvastatin (ZOCOR) 20 MG tablet; Take 1 tablet (20 mg total) by mouth daily.  Dispense: 90 tablet; Refill: 3  6. Need for immunization against influenza - Flu Vaccine QUAD 36+ mos IM   Continue all other maintenance medications as listed above.  Follow up plan: No follow-ups on file.  Educational handout given for survey  Remus Loffler PA-C [facility] [address]  [place], [place] [address] [phone]   01/04/2018, 10:05 PM

## 2018-03-07 ENCOUNTER — Other Ambulatory Visit: Payer: Self-pay | Admitting: Physician Assistant

## 2018-03-07 DIAGNOSIS — J309 Allergic rhinitis, unspecified: Secondary | ICD-10-CM

## 2018-03-08 ENCOUNTER — Other Ambulatory Visit: Payer: Self-pay | Admitting: Physician Assistant

## 2018-03-08 DIAGNOSIS — B001 Herpesviral vesicular dermatitis: Secondary | ICD-10-CM

## 2018-03-25 ENCOUNTER — Other Ambulatory Visit: Payer: Self-pay | Admitting: Physician Assistant

## 2018-03-25 DIAGNOSIS — B001 Herpesviral vesicular dermatitis: Secondary | ICD-10-CM

## 2018-03-29 ENCOUNTER — Ambulatory Visit (INDEPENDENT_AMBULATORY_CARE_PROVIDER_SITE_OTHER): Payer: Medicare Other | Admitting: Physician Assistant

## 2018-03-29 ENCOUNTER — Encounter: Payer: Self-pay | Admitting: Physician Assistant

## 2018-03-29 DIAGNOSIS — S12200S Unspecified displaced fracture of third cervical vertebra, sequela: Secondary | ICD-10-CM

## 2018-03-29 DIAGNOSIS — M150 Primary generalized (osteo)arthritis: Secondary | ICD-10-CM | POA: Diagnosis not present

## 2018-03-29 DIAGNOSIS — M159 Polyosteoarthritis, unspecified: Secondary | ICD-10-CM

## 2018-03-29 DIAGNOSIS — S8412XA Injury of peroneal nerve at lower leg level, left leg, initial encounter: Secondary | ICD-10-CM | POA: Diagnosis not present

## 2018-03-29 DIAGNOSIS — G540 Brachial plexus disorders: Secondary | ICD-10-CM | POA: Diagnosis not present

## 2018-03-29 DIAGNOSIS — Z79891 Long term (current) use of opiate analgesic: Secondary | ICD-10-CM | POA: Diagnosis not present

## 2018-03-29 MED ORDER — MONTELUKAST SODIUM 10 MG PO TABS: 10.0000 mg | ORAL_TABLET | Freq: Every day | ORAL | 3 refills | Status: DC

## 2018-03-29 MED ORDER — OXYCODONE HCL 15 MG PO TABS: 15.0000 mg | ORAL_TABLET | Freq: Four times a day (QID) | ORAL | 0 refills | Status: DC | PRN

## 2018-03-30 MED ORDER — OXYCODONE HCL 15 MG PO TABS: 15.0000 mg | ORAL_TABLET | Freq: Four times a day (QID) | ORAL | 0 refills | Status: DC | PRN

## 2018-03-30 NOTE — Progress Notes (Signed)
BP 125/83   Pulse (!) 109   Temp 98 F (36.7 C) (Oral)   Ht 5\' 10"  (1.778 m)   Wt 245 lb 6.4 oz (111.3 kg)   BMI 35.21 kg/m    Subjective:    Patient ID: Keith RubensteinJesse F Paynter, male    DOB: 03/17/1965, 53 y.o.   MRN: [id number]  HPI: Keith Robertson is a 53 y.o. male presenting on 03/29/2018 for Hyperlipidemia (3 month ) and Pain  This patient comes in for periodic recheck on medications and conditions including hyperlipid, allergies and chronic pain.   Patient reports despite taking cetirizine is still having a lot of sinus congestion.  He is using his nasal steroid spray.  He always found Claritin not to be effective.  We had a discussion about Singulair as a secondary means of treating his allergies and we will try this for a month and he will let us know how things are going.  Prescription was sent to his pharmacy.  We will also have labs for his hyperlipidemia at his next visit.  We will  All medications are reviewed today. There are no reports of any problems with the medications. All of the medical conditions are reviewed and updated.  Lab work is reviewed and will be ordered as medically necessary. There are no new problems reported with today's visit.  This patient returns for a 6 month recheck on narcotic use for trauma to peroneal nerve, leg and spine, neropathy and medication refills  Patient currently taking oxycodone 15 1-2 QID, gabapentin. Behavior- normal Medication side effects- none Any concerns- none  PMP AWARE website reviewed: Yes Any suspicious activity on PMP Aware: No MME daily dose: 180 mg   Past Medical History:  Diagnosis Date  . Broken femur (HCC)   . Broken leg   . Broken neck (HCC)   . Hypertension   . Muscle, jerky movements (uncontrolled)    Relevant past medical, surgical, family and social history reviewed and updated as indicated. Interim medical history since our last visit reviewed. Allergies and medications reviewed and updated. DATA REVIEWED:  CHART IN EPIC  Family History reviewed for pertinent findings.  Review of Systems  Constitutional: Negative.  Negative for appetite change and fatigue.  HENT: Negative.   Eyes: Negative.  Negative for pain and visual disturbance.  Respiratory: Negative.  Negative for cough, chest tightness, shortness of breath and wheezing.   Cardiovascular: Negative.  Negative for chest pain, palpitations and leg swelling.  Gastrointestinal: Negative.  Negative for abdominal pain, diarrhea, nausea and vomiting.  Endocrine: Negative.   Genitourinary: Negative.   Musculoskeletal: Positive for arthralgias, back pain, gait problem, joint swelling, myalgias, neck pain and neck stiffness.  Skin: Negative.  Negative for color change and rash.  Neurological: Negative for weakness, numbness and headaches.  Psychiatric/Behavioral: Negative.     Allergies as of 03/29/2018   No Known Allergies     Medication List       Accurate as of March 29, 2018 11:59 PM. Always use your most recent med list.        acetaminophen-codeine 300-30 MG tablet Commonly known as:  TYLENOL #3 Take 2 tablets by mouth every 8 (eight) hours as needed for moderate pain (HEADACHE).   cetirizine 10 MG tablet Commonly known as:  ZYRTEC TAKE 1 TABLET (10 MG TOTAL) BY MOUTH DAILY.   gabapentin 800 MG tablet Commonly known as:  NEURONTIN TAKE 1 TABLET BY MOUTH THREE TIMES A DAY   hydrochlorothiazide 25  MG tablet Commonly known as:  HYDRODIURIL TAKE 1 TABLET BY MOUTH EVERY DAY   meloxicam 15 MG tablet Commonly known as:  MOBIC Take 1 tablet (15 mg total) by mouth daily.   montelukast 10 MG tablet Commonly known as:  SINGULAIR Take 1 tablet (10 mg total) by mouth at bedtime.   omeprazole 20 MG capsule Commonly known as:  PRILOSEC Take 1 capsule (20 mg total) by mouth 2 (two) times daily.   oxyCODONE 15 MG immediate release tablet Commonly known as:  ROXICODONE Take 1-2 tablets (15-30 mg total) by mouth every 6 (six)  hours as needed for pain.   oxyCODONE 15 MG immediate release tablet Commonly known as:  ROXICODONE Take 1-2 tablets (15-30 mg total) by mouth every 6 (six) hours as needed for pain.   oxyCODONE 15 MG immediate release tablet Commonly known as:  ROXICODONE Take 1-2 tablets (15-30 mg total) by mouth every 6 (six) hours as needed for pain.   PROAIR HFA 108 (90 Base) MCG/ACT inhaler Generic drug:  albuterol USE 2 PUFFS ONCE A DAY   simvastatin 20 MG tablet Commonly known as:  ZOCOR Take 1 tablet (20 mg total) by mouth daily.   SYMBICORT 80-4.5 MCG/ACT inhaler Generic drug:  budesonide-formoterol USE 2 PUFFS TWICE A DAY 30 DAYS INHALATION 30 DAYS   tiZANidine 4 MG tablet Commonly known as:  ZANAFLEX TAKE 1 TABLET BY MOUTH EVERY 8 HOURS AS NEEDED   triamcinolone 55 MCG/ACT Aero nasal inhaler Commonly known as:  NASACORT Place 2 sprays into the nose daily.   valACYclovir 1000 MG tablet Commonly known as:  VALTREX TAKE 1 TABLET BY MOUTH TWICE DAILY, THEN TAKE 1 TABLET DAILY AS MAINTENANCE          Objective:    BP 125/83   Pulse (!) 109   Temp 98 F (36.7 C) (Oral)   Ht 5\' 10"  (1.778 m)   Wt 245 lb 6.4 oz (111.3 kg)   BMI 35.21 kg/m   No Known Allergies  Wt Readings from Last 3 Encounters:  03/29/18 245 lb 6.4 oz (111.3 kg)  01/04/18 240 lb (108.9 kg)  09/29/17 239 lb 9.6 oz (108.7 kg)    Physical Exam Constitutional:      Appearance: He is well-developed.  HENT:     Head: Normocephalic and atraumatic.  Eyes:     Conjunctiva/sclera: Conjunctivae normal.     Pupils: Pupils are equal, round, and reactive to light.  Neck:     Musculoskeletal: Normal range of motion and neck supple.  Cardiovascular:     Rate and Rhythm: Normal rate and regular rhythm.     Heart sounds: Normal heart sounds.  Pulmonary:     Effort: Pulmonary effort is normal.     Breath sounds: Normal breath sounds.  Abdominal:     General: Bowel sounds are normal.     Palpations: Abdomen  is soft.  Musculoskeletal:     Cervical back: He exhibits tenderness and bony tenderness.     Thoracic back: He exhibits decreased range of motion, tenderness and pain.     Lumbar back: He exhibits tenderness and bony tenderness.     Left lower leg: He exhibits tenderness, bony tenderness and deformity.       Legs:  Skin:    General: Skin is warm and dry.  Neurological:     Gait: Gait abnormal.         Assessment & Plan:   1. Closed displaced fracture of third  cervical vertebra, unspecified fracture morphology, sequela - ToxASSURE Select 13 (MW), Urine - oxyCODONE (ROXICODONE) 15 MG immediate release tablet; Take 1-2 tablets (15-30 mg total) by mouth every 6 (six) hours as needed for pain.  Dispense: 240 tablet; Refill: 0 - oxyCODONE (ROXICODONE) 15 MG immediate release tablet; Take 1-2 tablets (15-30 mg total) by mouth every 6 (six) hours as needed for pain.  Dispense: 240 tablet; Refill: 0 - oxyCODONE (ROXICODONE) 15 MG immediate release tablet; Take 1-2 tablets (15-30 mg total) by mouth every 6 (six) hours as needed for pain.  Dispense: 240 tablet; Refill: 0  2. Brachial plexopathy - ToxASSURE Select 13 (MW), Urine - oxyCODONE (ROXICODONE) 15 MG immediate release tablet; Take 1-2 tablets (15-30 mg total) by mouth every 6 (six) hours as needed for pain.  Dispense: 240 tablet; Refill: 0 - oxyCODONE (ROXICODONE) 15 MG immediate release tablet; Take 1-2 tablets (15-30 mg total) by mouth every 6 (six) hours as needed for pain.  Dispense: 240 tablet; Refill: 0 - oxyCODONE (ROXICODONE) 15 MG immediate release tablet; Take 1-2 tablets (15-30 mg total) by mouth every 6 (six) hours as needed for pain.  Dispense: 240 tablet; Refill: 0  3. Injury of left peroneal nerve, initial encounter - ToxASSURE Select 13 (MW), Urine - oxyCODONE (ROXICODONE) 15 MG immediate release tablet; Take 1-2 tablets (15-30 mg total) by mouth every 6 (six) hours as needed for pain.  Dispense: 240 tablet; Refill:  0 - oxyCODONE (ROXICODONE) 15 MG immediate release tablet; Take 1-2 tablets (15-30 mg total) by mouth every 6 (six) hours as needed for pain.  Dispense: 240 tablet; Refill: 0 - oxyCODONE (ROXICODONE) 15 MG immediate release tablet; Take 1-2 tablets (15-30 mg total) by mouth every 6 (six) hours as needed for pain.  Dispense: 240 tablet; Refill: 0  4. Primary osteoarthritis involving multiple joints - ToxASSURE Select 13 (MW), Urine - oxyCODONE (ROXICODONE) 15 MG immediate release tablet; Take 1-2 tablets (15-30 mg total) by mouth every 6 (six) hours as needed for pain.  Dispense: 240 tablet; Refill: 0 - oxyCODONE (ROXICODONE) 15 MG immediate release tablet; Take 1-2 tablets (15-30 mg total) by mouth every 6 (six) hours as needed for pain.  Dispense: 240 tablet; Refill: 0 - oxyCODONE (ROXICODONE) 15 MG immediate release tablet; Take 1-2 tablets (15-30 mg total) by mouth every 6 (six) hours as needed for pain.  Dispense: 240 tablet; Refill: 0   Continue all other maintenance medications as listed above.  Follow up plan: Return in about 3 months (around 06/27/2018).  Educational handout given for survey  Remus Loffler PA-C [facility] [address]  [place], [place] [address] [phone]   03/30/2018, 1:49 PM

## 2018-04-02 LAB — TOXASSURE SELECT 13 (MW), URINE

## 2018-04-06 ENCOUNTER — Ambulatory Visit: Payer: Medicare Other | Admitting: Physician Assistant

## 2018-04-25 ENCOUNTER — Other Ambulatory Visit: Payer: Self-pay | Admitting: Physician Assistant

## 2018-04-25 DIAGNOSIS — S8412XA Injury of peroneal nerve at lower leg level, left leg, initial encounter: Secondary | ICD-10-CM

## 2018-04-25 DIAGNOSIS — G540 Brachial plexus disorders: Secondary | ICD-10-CM

## 2018-05-03 ENCOUNTER — Other Ambulatory Visit: Payer: Self-pay | Admitting: Physician Assistant

## 2018-05-03 DIAGNOSIS — J309 Allergic rhinitis, unspecified: Secondary | ICD-10-CM

## 2018-06-29 ENCOUNTER — Other Ambulatory Visit: Payer: Self-pay

## 2018-06-30 ENCOUNTER — Ambulatory Visit (INDEPENDENT_AMBULATORY_CARE_PROVIDER_SITE_OTHER): Payer: Medicare Other | Admitting: Physician Assistant

## 2018-06-30 ENCOUNTER — Encounter: Payer: Self-pay | Admitting: Physician Assistant

## 2018-06-30 DIAGNOSIS — M150 Primary generalized (osteo)arthritis: Secondary | ICD-10-CM

## 2018-06-30 DIAGNOSIS — S12200S Unspecified displaced fracture of third cervical vertebra, sequela: Secondary | ICD-10-CM | POA: Diagnosis not present

## 2018-06-30 DIAGNOSIS — S8412XA Injury of peroneal nerve at lower leg level, left leg, initial encounter: Secondary | ICD-10-CM

## 2018-06-30 DIAGNOSIS — G540 Brachial plexus disorders: Secondary | ICD-10-CM

## 2018-06-30 DIAGNOSIS — M159 Polyosteoarthritis, unspecified: Secondary | ICD-10-CM

## 2018-06-30 MED ORDER — OXYCODONE HCL 15 MG PO TABS: 15.0000 mg | ORAL_TABLET | Freq: Three times a day (TID) | ORAL | 0 refills | Status: DC | PRN

## 2018-06-30 MED ORDER — OXYCODONE HCL 15 MG PO TABS: 15.0000 mg | ORAL_TABLET | Freq: Four times a day (QID) | ORAL | 0 refills | Status: DC | PRN

## 2018-06-30 MED ORDER — NALOXONE HCL 4 MG/0.1ML NA LIQD: 1.0000 | Freq: Once | NASAL | 1 refills | Status: AC
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
## 2018-07-06 NOTE — Progress Notes (Signed)
Agree with wean from opioid.  Ashly M. Nadine Counts, DO [facility]

## 2018-07-08 ENCOUNTER — Other Ambulatory Visit: Payer: Self-pay | Admitting: Physician Assistant

## 2018-07-08 DIAGNOSIS — B001 Herpesviral vesicular dermatitis: Secondary | ICD-10-CM

## 2018-07-18 ENCOUNTER — Other Ambulatory Visit: Payer: Self-pay | Admitting: Physician Assistant

## 2018-07-25 ENCOUNTER — Other Ambulatory Visit: Payer: Self-pay | Admitting: Physician Assistant

## 2018-08-02 ENCOUNTER — Other Ambulatory Visit: Payer: Self-pay | Admitting: Physician Assistant

## 2018-08-02 DIAGNOSIS — J309 Allergic rhinitis, unspecified: Secondary | ICD-10-CM

## 2018-08-20 ENCOUNTER — Other Ambulatory Visit: Payer: Self-pay | Admitting: Physician Assistant

## 2018-08-20 DIAGNOSIS — M159 Polyosteoarthritis, unspecified: Secondary | ICD-10-CM

## 2018-08-20 DIAGNOSIS — M150 Primary generalized (osteo)arthritis: Secondary | ICD-10-CM

## 2018-08-20 NOTE — Telephone Encounter (Signed)
Please advise last seen 06/2018 last filled 07/2017

## 2018-09-27 ENCOUNTER — Other Ambulatory Visit: Payer: Self-pay | Admitting: Physician Assistant

## 2018-09-27 NOTE — Telephone Encounter (Signed)
RFs for medication was on 06/30/18, 07/30/18 & 08/27/18 per [place] moved up from 10/01/18 to 09/28/18

## 2018-09-28 ENCOUNTER — Encounter: Payer: Self-pay | Admitting: Physician Assistant

## 2018-09-28 ENCOUNTER — Ambulatory Visit (INDEPENDENT_AMBULATORY_CARE_PROVIDER_SITE_OTHER): Payer: Medicare Other | Admitting: Physician Assistant

## 2018-09-28 DIAGNOSIS — G540 Brachial plexus disorders: Secondary | ICD-10-CM

## 2018-09-28 DIAGNOSIS — M159 Polyosteoarthritis, unspecified: Secondary | ICD-10-CM

## 2018-09-28 DIAGNOSIS — M150 Primary generalized (osteo)arthritis: Secondary | ICD-10-CM | POA: Diagnosis not present

## 2018-09-28 DIAGNOSIS — S12200S Unspecified displaced fracture of third cervical vertebra, sequela: Secondary | ICD-10-CM | POA: Diagnosis not present

## 2018-09-28 DIAGNOSIS — S8412XA Injury of peroneal nerve at lower leg level, left leg, initial encounter: Secondary | ICD-10-CM

## 2018-09-28 MED ORDER — OXYCODONE HCL 15 MG PO TABS: 15.0000 mg | ORAL_TABLET | Freq: Four times a day (QID) | ORAL | 0 refills | Status: DC | PRN

## 2018-09-28 MED ORDER — OXYCODONE HCL 15 MG PO TABS: 15.0000 mg | ORAL_TABLET | Freq: Three times a day (TID) | ORAL | 0 refills | Status: DC | PRN
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
## 2018-10-01 ENCOUNTER — Ambulatory Visit: Payer: Medicare Other | Admitting: Physician Assistant

## 2018-10-10 ENCOUNTER — Other Ambulatory Visit: Payer: Self-pay | Admitting: Physician Assistant

## 2018-10-11 ENCOUNTER — Other Ambulatory Visit: Payer: Self-pay | Admitting: Physician Assistant

## 2018-10-12 ENCOUNTER — Other Ambulatory Visit: Payer: Self-pay | Admitting: Physician Assistant

## 2018-10-12 DIAGNOSIS — G540 Brachial plexus disorders: Secondary | ICD-10-CM

## 2018-10-12 DIAGNOSIS — S8412XA Injury of peroneal nerve at lower leg level, left leg, initial encounter: Secondary | ICD-10-CM

## 2018-10-25 ENCOUNTER — Other Ambulatory Visit: Payer: Self-pay | Admitting: Physician Assistant

## 2018-10-25 MED ORDER — GABAPENTIN 800 MG PO TABS: 800.0000 mg | ORAL_TABLET | Freq: Three times a day (TID) | ORAL | 1 refills | Status: DC

## 2018-10-25 NOTE — Telephone Encounter (Signed)
Last seen 8/18 last filled 6/15

## 2018-11-01 ENCOUNTER — Ambulatory Visit (INDEPENDENT_AMBULATORY_CARE_PROVIDER_SITE_OTHER): Payer: Medicare Other | Admitting: *Deleted

## 2018-11-01 DIAGNOSIS — Z0000 Encounter for general adult medical examination without abnormal findings: Secondary | ICD-10-CM

## 2018-11-01 NOTE — Patient Instructions (Signed)
Preventive Care 52-52 Years Old, Male Preventive care refers to lifestyle choices and visits with your health care provider that can promote health and wellness. This includes:  A yearly physical exam. This is also called an annual well check.  Regular dental and eye exams.  Immunizations.  Screening for certain conditions.  Healthy lifestyle choices, such as eating a healthy diet, getting regular exercise, not using drugs or products that contain nicotine and tobacco, and limiting alcohol use. What can I expect for my preventive care visit? Physical exam Your health care provider will check:  Height and weight. These may be used to calculate body mass index (BMI), which is a measurement that tells if you are at a healthy weight.  Heart rate and blood pressure.  Your skin for abnormal spots. Counseling Your health care provider may ask you questions about:  Alcohol, tobacco, and drug use.  Emotional well-being.  Home and relationship well-being.  Sexual activity.  Eating habits.  Work and work environment. What immunizations do I need?  Influenza (flu) vaccine  This is recommended every year. Tetanus, diphtheria, and pertussis (Tdap) vaccine  You may need a Td booster every 10 years. Varicella (chickenpox) vaccine  You may need this vaccine if you have not already been vaccinated. Zoster (shingles) vaccine  You may need this after age 52. Measles, mumps, and rubella (MMR) vaccine  You may need at least one dose of MMR if you were born in 1957 or later. You may also need a second dose. Pneumococcal conjugate (PCV13) vaccine  You may need this if you have certain conditions and were not previously vaccinated. Pneumococcal polysaccharide (PPSV23) vaccine  You may need one or two doses if you smoke cigarettes or if you have certain conditions. Meningococcal conjugate (MenACWY) vaccine  You may need this if you have certain conditions. Hepatitis A vaccine   You may need this if you have certain conditions or if you travel or work in places where you may be exposed to hepatitis A. Hepatitis B vaccine  You may need this if you have certain conditions or if you travel or work in places where you may be exposed to hepatitis B. Haemophilus influenzae type b (Hib) vaccine  You may need this if you have certain risk factors. Human papillomavirus (HPV) vaccine  If recommended by your health care provider, you may need three doses over 6 months. You may receive vaccines as individual doses or as more than one vaccine together in one shot (combination vaccines). Talk with your health care provider about the risks and benefits of combination vaccines. What tests do I need? Blood tests  Lipid and cholesterol levels. These may be checked every 5 years, or more frequently if you are over 52 years old.  Hepatitis C test.  Hepatitis B test. Screening  Lung cancer screening. You may have this screening every year starting at age 52 if you have a 30-pack-year history of smoking and currently smoke or have quit within the past 15 years.  Prostate cancer screening. Recommendations will vary depending on your family history and other risks.  Colorectal cancer screening. All adults should have this screening starting at age 52 and continuing until age 75. Your health care provider may recommend screening at age 52 if you are at increased risk. You will have tests every 1-10 years, depending on your results and the type of screening test.  Diabetes screening. This is done by checking your blood sugar (glucose) after you have not eaten   for a while (fasting). You may have this done every 1-3 years.  Sexually transmitted disease (STD) testing. Follow these instructions at home: Eating and drinking  Eat a diet that includes fresh fruits and vegetables, whole grains, lean protein, and low-fat dairy products.  Take vitamin and mineral supplements as recommended  by your health care provider.  Do not drink alcohol if your health care provider tells you not to drink.  If you drink alcohol: ? Limit how much you have to 0-2 drinks a day. ? Be aware of how much alcohol is in your drink. In the [place] one drink equals one 12 oz bottle of beer (355 mL), one 5 oz glass of wine (148 mL), or one 1 oz glass of hard liquor (44 mL). Lifestyle  Take daily care of your teeth and gums.  Stay active. Exercise for at least 30 minutes on 5 or more days each week.  Do not use any products that contain nicotine or tobacco, such as cigarettes, e-cigarettes, and chewing tobacco. If you need help quitting, ask your health care provider.  If you are sexually active, practice safe sex. Use a condom or other form of protection to prevent STIs (sexually transmitted infections).  Talk with your health care provider about taking a low-dose aspirin every day starting at age 33. What's next?  Go to your health care provider once a year for a well check visit.  Ask your health care provider how often you should have your eyes and teeth checked.  Stay up to date on all vaccines. This information is not intended to replace advice given to you by your health care provider. Make sure you discuss any questions you have with your health care provider. Document Released: 02/23/2015 Document Revised: 01/21/2018 Document Reviewed: 01/21/2018 Elsevier Patient Education  2020 [employer].
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
## 2018-11-04 ENCOUNTER — Other Ambulatory Visit: Payer: Self-pay | Admitting: Physician Assistant

## 2018-11-26 ENCOUNTER — Ambulatory Visit: Payer: Medicare Other | Admitting: Physician Assistant

## 2018-11-26 ENCOUNTER — Other Ambulatory Visit: Payer: Self-pay | Admitting: Physician Assistant

## 2018-11-26 ENCOUNTER — Encounter: Payer: Self-pay | Admitting: Physician Assistant

## 2018-11-26 ENCOUNTER — Telehealth: Payer: Self-pay | Admitting: Physician Assistant

## 2018-11-26 DIAGNOSIS — G540 Brachial plexus disorders: Secondary | ICD-10-CM

## 2018-11-26 DIAGNOSIS — S8412XA Injury of peroneal nerve at lower leg level, left leg, initial encounter: Secondary | ICD-10-CM

## 2018-11-26 DIAGNOSIS — M150 Primary generalized (osteo)arthritis: Secondary | ICD-10-CM

## 2018-11-26 DIAGNOSIS — M159 Polyosteoarthritis, unspecified: Secondary | ICD-10-CM

## 2018-11-26 DIAGNOSIS — S12200S Unspecified displaced fracture of third cervical vertebra, sequela: Secondary | ICD-10-CM

## 2018-11-26 MED ORDER — OXYCODONE HCL 15 MG PO TABS: 15.0000 mg | ORAL_TABLET | Freq: Four times a day (QID) | ORAL | 0 refills | Status: DC | PRN

## 2018-11-26 NOTE — Telephone Encounter (Signed)
I have sent a my chart message back to him we will talk through that mode.

## 2018-11-26 NOTE — Telephone Encounter (Signed)
AJ, see his message - would you like to do tele today instead of MON so pt doesn't run out.

## 2018-11-29 ENCOUNTER — Ambulatory Visit (INDEPENDENT_AMBULATORY_CARE_PROVIDER_SITE_OTHER): Payer: Medicare Other | Admitting: Physician Assistant

## 2018-11-29 DIAGNOSIS — S8412XD Injury of peroneal nerve at lower leg level, left leg, subsequent encounter: Secondary | ICD-10-CM

## 2018-11-29 DIAGNOSIS — S12200S Unspecified displaced fracture of third cervical vertebra, sequela: Secondary | ICD-10-CM

## 2018-11-29 DIAGNOSIS — M150 Primary generalized (osteo)arthritis: Secondary | ICD-10-CM

## 2018-11-29 DIAGNOSIS — M8949 Other hypertrophic osteoarthropathy, multiple sites: Secondary | ICD-10-CM | POA: Diagnosis not present

## 2018-11-29 DIAGNOSIS — G540 Brachial plexus disorders: Secondary | ICD-10-CM

## 2018-11-29 DIAGNOSIS — S8412XA Injury of peroneal nerve at lower leg level, left leg, initial encounter: Secondary | ICD-10-CM

## 2018-11-29 DIAGNOSIS — M159 Polyosteoarthritis, unspecified: Secondary | ICD-10-CM

## 2018-11-29 MED ORDER — OXYCODONE HCL 20 MG PO TABS: 20.0000 mg | ORAL_TABLET | ORAL | 0 refills | Status: DC | PRN
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
## 2018-12-02 ENCOUNTER — Other Ambulatory Visit: Payer: Self-pay | Admitting: Physician Assistant

## 2018-12-02 ENCOUNTER — Encounter: Payer: Self-pay | Admitting: Physician Assistant

## 2018-12-08 NOTE — Progress Notes (Signed)
Appointment 11/16

## 2018-12-24 ENCOUNTER — Other Ambulatory Visit: Payer: Self-pay

## 2018-12-24 ENCOUNTER — Other Ambulatory Visit: Payer: Self-pay | Admitting: Physician Assistant

## 2018-12-27 ENCOUNTER — Other Ambulatory Visit: Payer: Self-pay

## 2018-12-27 ENCOUNTER — Other Ambulatory Visit: Payer: Self-pay | Admitting: Physician Assistant

## 2018-12-27 ENCOUNTER — Encounter: Payer: Self-pay | Admitting: Physician Assistant

## 2018-12-27 ENCOUNTER — Ambulatory Visit (INDEPENDENT_AMBULATORY_CARE_PROVIDER_SITE_OTHER): Payer: Medicare Other | Admitting: Physician Assistant

## 2018-12-27 VITALS — BP 119/78 | HR 95 | Temp 98.4°F | Ht 70.0 in | Wt 247.2 lb

## 2018-12-27 DIAGNOSIS — M8949 Other hypertrophic osteoarthropathy, multiple sites: Secondary | ICD-10-CM

## 2018-12-27 DIAGNOSIS — Z23 Encounter for immunization: Secondary | ICD-10-CM | POA: Diagnosis not present

## 2018-12-27 DIAGNOSIS — S8412XS Injury of peroneal nerve at lower leg level, left leg, sequela: Secondary | ICD-10-CM

## 2018-12-27 DIAGNOSIS — S8412XD Injury of peroneal nerve at lower leg level, left leg, subsequent encounter: Secondary | ICD-10-CM | POA: Diagnosis not present

## 2018-12-27 DIAGNOSIS — S12200D Unspecified displaced fracture of third cervical vertebra, subsequent encounter for fracture with routine healing: Secondary | ICD-10-CM | POA: Diagnosis not present

## 2018-12-27 DIAGNOSIS — S12200S Unspecified displaced fracture of third cervical vertebra, sequela: Secondary | ICD-10-CM

## 2018-12-27 DIAGNOSIS — Z1211 Encounter for screening for malignant neoplasm of colon: Secondary | ICD-10-CM

## 2018-12-27 DIAGNOSIS — S8412XA Injury of peroneal nerve at lower leg level, left leg, initial encounter: Secondary | ICD-10-CM

## 2018-12-27 DIAGNOSIS — M150 Primary generalized (osteo)arthritis: Secondary | ICD-10-CM

## 2018-12-27 DIAGNOSIS — Z8781 Personal history of (healed) traumatic fracture: Secondary | ICD-10-CM

## 2018-12-27 DIAGNOSIS — Z0000 Encounter for general adult medical examination without abnormal findings: Secondary | ICD-10-CM

## 2018-12-27 DIAGNOSIS — G540 Brachial plexus disorders: Secondary | ICD-10-CM | POA: Diagnosis not present

## 2018-12-27 DIAGNOSIS — E782 Mixed hyperlipidemia: Secondary | ICD-10-CM | POA: Diagnosis not present

## 2018-12-27 DIAGNOSIS — M159 Polyosteoarthritis, unspecified: Secondary | ICD-10-CM

## 2018-12-27 DIAGNOSIS — B001 Herpesviral vesicular dermatitis: Secondary | ICD-10-CM

## 2018-12-27 DIAGNOSIS — Z125 Encounter for screening for malignant neoplasm of prostate: Secondary | ICD-10-CM | POA: Diagnosis not present

## 2018-12-27 MED ORDER — OXYCODONE HCL 20 MG PO TABS: 20.0000 mg | ORAL_TABLET | Freq: Three times a day (TID) | ORAL | 0 refills | Status: DC | PRN

## 2018-12-28 LAB — CMP14+EGFR
ALT: 23 IU/L (ref 0–44)
AST: 29 IU/L (ref 0–40)
Albumin/Globulin Ratio: 2.2 (ref 1.2–2.2)
Albumin: 5.2 g/dL — ABNORMAL HIGH (ref 3.8–4.9)
Alkaline Phosphatase: 71 IU/L (ref 39–117)
BUN/Creatinine Ratio: 14 (ref 9–20)
BUN: 13 mg/dL (ref 6–24)
Bilirubin Total: 0.6 mg/dL (ref 0.0–1.2)
CO2: 21 mmol/L (ref 20–29)
Calcium: 10.8 mg/dL — ABNORMAL HIGH (ref 8.7–10.2)
Chloride: 101 mmol/L (ref 96–106)
Creatinine, Ser: 0.92 mg/dL (ref 0.76–1.27)
GFR calc Af Amer: 111 mL/min/{1.73_m2} (ref >59)
GFR calc non Af Amer: 96 mL/min/{1.73_m2} (ref >59)
Globulin, Total: 2.4 g/dL (ref 1.5–4.5)
Glucose: 104 mg/dL — ABNORMAL HIGH (ref 65–99)
Potassium: 4.3 mmol/L (ref 3.5–5.2)
Sodium: 142 mmol/L (ref 134–144)
Total Protein: 7.6 g/dL (ref 6.0–8.5)

## 2018-12-28 LAB — LIPID PANEL
Chol/HDL Ratio: 2.7 ratio (ref 0.0–5.0)
Cholesterol, Total: 183 mg/dL (ref 100–199)
HDL: 69 mg/dL (ref >39)
LDL Chol Calc (NIH): 98 mg/dL (ref 0–99)
Triglycerides: 88 mg/dL (ref 0–149)
VLDL Cholesterol Cal: 16 mg/dL (ref 5–40)

## 2018-12-28 LAB — TSH: TSH: 0.325 u[IU]/mL — ABNORMAL LOW (ref 0.450–4.500)

## 2018-12-28 LAB — PSA: Prostate Specific Ag, Serum: 0.2 ng/mL (ref 0.0–4.0)

## 2018-12-29 ENCOUNTER — Other Ambulatory Visit: Payer: Self-pay | Admitting: Physician Assistant

## 2018-12-29 ENCOUNTER — Other Ambulatory Visit: Payer: Self-pay | Admitting: *Deleted

## 2018-12-29 DIAGNOSIS — R7989 Other specified abnormal findings of blood chemistry: Secondary | ICD-10-CM
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
## 2019-01-02 ENCOUNTER — Other Ambulatory Visit: Payer: Self-pay | Admitting: Physician Assistant

## 2019-01-19 ENCOUNTER — Other Ambulatory Visit: Payer: Self-pay | Admitting: Physician Assistant

## 2019-01-19 DIAGNOSIS — E782 Mixed hyperlipidemia: Secondary | ICD-10-CM

## 2019-01-24 ENCOUNTER — Ambulatory Visit (INDEPENDENT_AMBULATORY_CARE_PROVIDER_SITE_OTHER): Payer: Medicare Other | Admitting: Physician Assistant

## 2019-01-24 ENCOUNTER — Encounter: Payer: Self-pay | Admitting: Physician Assistant

## 2019-01-24 DIAGNOSIS — M8949 Other hypertrophic osteoarthropathy, multiple sites: Secondary | ICD-10-CM

## 2019-01-24 DIAGNOSIS — S8412XA Injury of peroneal nerve at lower leg level, left leg, initial encounter: Secondary | ICD-10-CM

## 2019-01-24 DIAGNOSIS — S12200S Unspecified displaced fracture of third cervical vertebra, sequela: Secondary | ICD-10-CM

## 2019-01-24 DIAGNOSIS — G540 Brachial plexus disorders: Secondary | ICD-10-CM

## 2019-01-24 DIAGNOSIS — M159 Polyosteoarthritis, unspecified: Secondary | ICD-10-CM

## 2019-01-24 MED ORDER — OXYCODONE HCL 20 MG PO TABS: 20.0000 mg | ORAL_TABLET | Freq: Three times a day (TID) | ORAL | 0 refills | Status: DC | PRN

## 2019-01-24 NOTE — Progress Notes (Signed)
Telephone visit  Subjective: BW:IOMBTDH 1 month pain PCP: Remus Loffler, PA-C RCB:ULAGT Keith Robertson is a 53 y.o. male calls for telephone consult today. Patient provides verbal consent for consult held via phone.  Patient is identified with 2 separate identifiers.  At this time the entire area is on COVID-19 social distancing and stay home orders are in place.  Patient is of higher risk and therefore we are performing this by a virtual method.  Location of patient: home Location of provider: [facility] Others present for call: no  This patient is having a 4-week recheck for his chronic pain medication.  He does have an appointment on 01/27/2019 With the [facility] pain clinic in [place].  They had to postpone the appointment a couple of times because of his wife being positive for Covid.  He himself does not have any symptoms and he tested negative.  Also there young adult son tested negative.  Have reiterated to him that we will do monthly rechecks until they take over the pain medication.  I will send 1 prescription into his local pharmacy.  He is up-to-date on contract and urine drug screen. Contract 01-12-2019 UDS 03/29/2018 MME: 90 (down from 180) PDMP is reviewed, no red flags.  ROS: Per HPI  No Known Allergies Past Medical History:  Diagnosis Date  . Broken femur ([facility])   . Broken leg   . Broken neck ([facility])   . Hypertension   . Muscle, jerky movements (uncontrolled)     Current Outpatient Medications:  .  albuterol (PROAIR HFA) 108 (90 Base) MCG/ACT inhaler, USE 2 PUFFS ONCE A DAY, Disp: , Rfl:  .  aspirin EC 81 MG tablet, Take 81 mg by mouth daily., Disp: , Rfl:  .  cetirizine (ZYRTEC) 10 MG tablet, TAKE 1 TABLET BY MOUTH EVERY DAY, Disp: 90 tablet, Rfl: 1 .  gabapentin (NEURONTIN) 800 MG tablet, Take 1 tablet (800 mg total) by mouth 3 (three) times daily., Disp: 270 tablet, Rfl: 1 .  hydrochlorothiazide (HYDRODIURIL) 25 MG tablet, TAKE 1 TABLET BY MOUTH EVERY DAY, Disp:  90 tablet, Rfl: 0 .  meloxicam (MOBIC) 15 MG tablet, TAKE 1 TABLET BY MOUTH EVERY DAY, Disp: 30 tablet, Rfl: 11 .  montelukast (SINGULAIR) 10 MG tablet, Take 1 tablet (10 mg total) by mouth at bedtime., Disp: 90 tablet, Rfl: 3 .  omeprazole (PRILOSEC) 20 MG capsule, TAKE 1 CAPSULE BY MOUTH TWICE A DAY, Disp: 60 capsule, Rfl: 6 .  Oxycodone HCl 20 MG TABS, Take 1 tablet (20 mg total) by mouth every 8 (eight) hours as needed (pain)., Disp: 90 tablet, Rfl: 0 .  simvastatin (ZOCOR) 20 MG tablet, TAKE 1 TABLET BY MOUTH EVERY DAY, Disp: 90 tablet, Rfl: 1 .  SYMBICORT 80-4.5 MCG/ACT inhaler, USE 2 PUFFS TWICE A DAY 30 DAYS INHALATION 30 DAYS, Disp: 30.6 Inhaler, Rfl: 4 .  tiZANidine (ZANAFLEX) 4 MG tablet, TAKE 1 TABLET BY MOUTH EVERY 8 HOURS AS NEEDED, Disp: 90 tablet, Rfl: 5 .  triamcinolone (NASACORT) 55 MCG/ACT AERO nasal inhaler, Place 2 sprays into the nose daily., Disp: 1 Inhaler, Rfl: 12 .  valACYclovir (VALTREX) 1000 MG tablet, TAKE 1 TABLET BY MOUTH TWICE DAILY, THEN TAKE 1 TABLET DAILY AS MAINTENANCE, Disp: 40 tablet, Rfl: 1  Assessment/ Plan: 53 y.o. male   1. Closed displaced fracture of third cervical vertebra, unspecified fracture morphology, sequela - Oxycodone HCl 20 MG TABS; Take 1 tablet (20 mg total) by mouth every 8 (eight) hours  as needed (pain).  Dispense: 90 tablet; Refill: 0  2. Brachial plexopathy - Oxycodone HCl 20 MG TABS; Take 1 tablet (20 mg total) by mouth every 8 (eight) hours as needed (pain).  Dispense: 90 tablet; Refill: 0  3. Injury of left peroneal nerve, subsequentt encounter - Oxycodone HCl 20 MG TABS; Take 1 tablet (20 mg total) by mouth every 8 (eight) hours as needed (pain).  Dispense: 90 tablet; Refill: 0  4. Primary osteoarthritis involving multiple joints - Oxycodone HCl 20 MG TABS; Take 1 tablet (20 mg total) by mouth every 8 (eight) hours as needed (pain).  Dispense: 90 tablet; Refill: 0   Recheck 4 weeks 02/21/2019 @10 :10 AM Appointment given to  patient  Continue all other maintenance medications as listed above.  Start time: 11:49 AM End time: 12:03 PM  Meds ordered this encounter  Medications  . Oxycodone HCl 20 MG TABS    Sig: Take 1 tablet (20 mg total) by mouth every 8 (eight) hours as needed (pain).    Dispense:  90 tablet    Refill:  0    Order Specific Question:   Supervising Provider    Answer:   Janora Norlander [[id number]]    Particia Nearing PA-C [place] [phone]

## 2019-01-24 NOTE — Progress Notes (Signed)
Agree with transfer of care to pain clinic.  Special circumstances regarding COVID19 + household family member that requires social distancing, warrants phone visit for controlled substance and bridge until can establish care with pain clinic.  Ashly M. Lajuana Ripple, [facility]

## 2019-01-30 ENCOUNTER — Other Ambulatory Visit: Payer: Self-pay | Admitting: Physician Assistant

## 2019-01-30 DIAGNOSIS — J309 Allergic rhinitis, unspecified: Secondary | ICD-10-CM

## 2019-02-16 ENCOUNTER — Encounter: Payer: Self-pay | Admitting: Physician Assistant

## 2019-02-21 ENCOUNTER — Other Ambulatory Visit: Payer: Self-pay

## 2019-02-21 ENCOUNTER — Ambulatory Visit (INDEPENDENT_AMBULATORY_CARE_PROVIDER_SITE_OTHER): Payer: Medicare Other | Admitting: Physician Assistant

## 2019-02-21 ENCOUNTER — Encounter: Payer: Self-pay | Admitting: Physician Assistant

## 2019-02-21 DIAGNOSIS — G540 Brachial plexus disorders: Secondary | ICD-10-CM

## 2019-02-21 DIAGNOSIS — M8949 Other hypertrophic osteoarthropathy, multiple sites: Secondary | ICD-10-CM

## 2019-02-21 DIAGNOSIS — S12200S Unspecified displaced fracture of third cervical vertebra, sequela: Secondary | ICD-10-CM

## 2019-02-21 DIAGNOSIS — M159 Polyosteoarthritis, unspecified: Secondary | ICD-10-CM

## 2019-02-21 DIAGNOSIS — S8412XA Injury of peroneal nerve at lower leg level, left leg, initial encounter: Secondary | ICD-10-CM | POA: Diagnosis not present

## 2019-02-21 MED ORDER — OXYCODONE HCL 20 MG PO TABS: 20.0000 mg | ORAL_TABLET | Freq: Three times a day (TID) | ORAL | 0 refills | Status: AC | PRN

## 2019-02-21 NOTE — Progress Notes (Signed)
Telephone visit  Subjective: PZ:WCHENID pain med PCP: Remus Loffler, PA-C POE:UMPNT Keith Robertson is a 54 y.o. male calls for telephone consult today. Patient provides verbal consent for consult held via phone.  Patient is identified with 2 separate identifiers.  At this time the entire area is on COVID-19 social distancing and stay home orders are in place.  Patient is of higher risk and therefore we are performing this by a virtual method.  Location of patient: home Location of provider: [facility] Others present for call: no  Patient has gotten started with [facility] pain clinic in [place].  He was seen last month and they have started medication they are going to use in combination with the oxycodone.  He has an appointment in the beginning of February for recheck with them.  The conversation with the provider left it that I would prescribe 1 more time.  This is what I told the patient that we can do in case he needed prescribing.  The PDMP shows this.  So we will get a send 1 more prescription in to the pharmacy.  And he will call us if there are any difficulties.  He will come in soon for labs.  And they will get back on a 70-month recheck.   ROS: Per HPI  No Known Allergies Past Medical History:  Diagnosis Date  . Broken femur ([facility])   . Broken leg   . Broken neck ([facility])   . Hypertension   . Muscle, jerky movements (uncontrolled)     Current Outpatient Medications:  .  albuterol (PROAIR HFA) 108 (90 Base) MCG/ACT inhaler, USE 2 PUFFS ONCE A DAY, Disp: , Rfl:  .  aspirin EC 81 MG tablet, Take 81 mg by mouth daily., Disp: , Rfl:  .  cetirizine (ZYRTEC) 10 MG tablet, TAKE 1 TABLET BY MOUTH EVERY DAY, Disp: 90 tablet, Rfl: 1 .  gabapentin (NEURONTIN) 800 MG tablet, Take 1 tablet (800 mg total) by mouth 3 (three) times daily., Disp: 270 tablet, Rfl: 1 .  hydrochlorothiazide (HYDRODIURIL) 25 MG tablet, TAKE 1 TABLET BY MOUTH EVERY DAY, Disp: 90 tablet, Rfl: 0 .  meloxicam (MOBIC) 15 MG  tablet, TAKE 1 TABLET BY MOUTH EVERY DAY, Disp: 30 tablet, Rfl: 11 .  montelukast (SINGULAIR) 10 MG tablet, Take 1 tablet (10 mg total) by mouth at bedtime., Disp: 90 tablet, Rfl: 3 .  omeprazole (PRILOSEC) 20 MG capsule, TAKE 1 CAPSULE BY MOUTH TWICE A DAY, Disp: 60 capsule, Rfl: 6 .  Oxycodone HCl 20 MG TABS, Take 1 tablet (20 mg total) by mouth every 8 (eight) hours as needed (pain)., Disp: 90 tablet, Rfl: 0 .  simvastatin (ZOCOR) 20 MG tablet, TAKE 1 TABLET BY MOUTH EVERY DAY, Disp: 90 tablet, Rfl: 1 .  SYMBICORT 80-4.5 MCG/ACT inhaler, USE 2 PUFFS TWICE A DAY 30 DAYS INHALATION 30 DAYS, Disp: 30.6 Inhaler, Rfl: 4 .  tiZANidine (ZANAFLEX) 4 MG tablet, TAKE 1 TABLET BY MOUTH EVERY 8 HOURS AS NEEDED, Disp: 90 tablet, Rfl: 5 .  triamcinolone (NASACORT) 55 MCG/ACT AERO nasal inhaler, Place 2 sprays into the nose daily., Disp: 1 Inhaler, Rfl: 12 .  valACYclovir (VALTREX) 1000 MG tablet, TAKE 1 TABLET BY MOUTH TWICE DAILY, THEN TAKE 1 TABLET DAILY AS MAINTENANCE, Disp: 40 tablet, Rfl: 1  Assessment/ Plan: 54 y.o. male   1. Closed displaced fracture of third cervical vertebra, unspecified fracture morphology, sequela - Oxycodone HCl 20 MG TABS; Take 1 tablet (20 mg total)  by mouth every 8 (eight) hours as needed (pain).  Dispense: 90 tablet; Refill: 0  2. Brachial plexopathy - Oxycodone HCl 20 MG TABS; Take 1 tablet (20 mg total) by mouth every 8 (eight) hours as needed (pain).  Dispense: 90 tablet; Refill: 0  3. Injury of left peroneal nerve, subsequentt encounter - Oxycodone HCl 20 MG TABS; Take 1 tablet (20 mg total) by mouth every 8 (eight) hours as needed (pain).  Dispense: 90 tablet; Refill: 0  4. Primary osteoarthritis involving multiple joints - Oxycodone HCl 20 MG TABS; Take 1 tablet (20 mg total) by mouth every 8 (eight) hours as needed (pain).  Dispense: 90 tablet; Refill: 0   No follow-ups on file.  Continue all other maintenance medications as listed above.  Start time: 10:10  AM End time: 10:20 AM  Meds ordered this encounter  Medications  . Oxycodone HCl 20 MG TABS    Sig: Take 1 tablet (20 mg total) by mouth every 8 (eight) hours as needed (pain).    Dispense:  90 tablet    Refill:  0    Order Specific Question:   Supervising Provider    Answer:   Janora Norlander [[id number]]    Particia Nearing PA-C [place] [phone]

## 2019-02-24 ENCOUNTER — Other Ambulatory Visit: Payer: Self-pay

## 2019-02-25 ENCOUNTER — Other Ambulatory Visit: Payer: Medicare Other

## 2019-02-25 DIAGNOSIS — R6889 Other general symptoms and signs: Secondary | ICD-10-CM | POA: Diagnosis not present

## 2019-02-25 DIAGNOSIS — R7989 Other specified abnormal findings of blood chemistry: Secondary | ICD-10-CM

## 2019-02-26 LAB — THYROID PANEL WITH TSH
Free Thyroxine Index: 2.3 (ref 1.2–4.9)
T3 Uptake Ratio: 25 % (ref 24–39)
T4, Total: 9.2 ug/dL (ref 4.5–12.0)
TSH: 1.15 u[IU]/mL (ref 0.450–4.500)

## 2019-03-23 DIAGNOSIS — M153 Secondary multiple arthritis: Secondary | ICD-10-CM | POA: Diagnosis not present

## 2019-03-23 DIAGNOSIS — M129 Arthropathy, unspecified: Secondary | ICD-10-CM | POA: Diagnosis not present

## 2019-03-23 DIAGNOSIS — E559 Vitamin D deficiency, unspecified: Secondary | ICD-10-CM | POA: Diagnosis not present

## 2019-03-23 DIAGNOSIS — R5383 Other fatigue: Secondary | ICD-10-CM | POA: Diagnosis not present

## 2019-03-23 DIAGNOSIS — Z79899 Other long term (current) drug therapy: Secondary | ICD-10-CM | POA: Diagnosis not present

## 2019-03-25 ENCOUNTER — Other Ambulatory Visit: Payer: Self-pay | Admitting: Physician Assistant

## 2019-04-01 ENCOUNTER — Other Ambulatory Visit: Payer: Self-pay | Admitting: Physician Assistant

## 2019-04-01 DIAGNOSIS — G540 Brachial plexus disorders: Secondary | ICD-10-CM

## 2019-04-01 DIAGNOSIS — S8412XA Injury of peroneal nerve at lower leg level, left leg, initial encounter: Secondary | ICD-10-CM

## 2019-04-01 NOTE — Telephone Encounter (Signed)
Pt was last seen 10/2018. At that visit he was prescribed Tizanidine #90 with 5 refills. Pt has a follow up scheduled 11/02/19 for a AWV

## 2019-04-04 ENCOUNTER — Other Ambulatory Visit: Payer: Self-pay | Admitting: Physician Assistant

## 2019-04-13 ENCOUNTER — Other Ambulatory Visit: Payer: Self-pay | Admitting: Physician Assistant

## 2019-04-18 ENCOUNTER — Encounter: Payer: Self-pay | Admitting: Physician Assistant

## 2019-04-18 ENCOUNTER — Other Ambulatory Visit: Payer: Self-pay | Admitting: Physician Assistant

## 2019-04-18 MED ORDER — HYDROCHLOROTHIAZIDE 25 MG PO TABS: 25.0000 mg | ORAL_TABLET | Freq: Every day | ORAL | 0 refills | Status: DC

## 2019-04-20 DIAGNOSIS — Z79899 Other long term (current) drug therapy: Secondary | ICD-10-CM | POA: Diagnosis not present

## 2019-04-20 DIAGNOSIS — Z79891 Long term (current) use of opiate analgesic: Secondary | ICD-10-CM | POA: Diagnosis not present

## 2019-04-20 DIAGNOSIS — F17210 Nicotine dependence, cigarettes, uncomplicated: Secondary | ICD-10-CM | POA: Diagnosis not present

## 2019-04-20 DIAGNOSIS — G540 Brachial plexus disorders: Secondary | ICD-10-CM | POA: Diagnosis not present

## 2019-05-02 ENCOUNTER — Other Ambulatory Visit: Payer: Self-pay | Admitting: Physician Assistant

## 2019-05-02 DIAGNOSIS — B001 Herpesviral vesicular dermatitis: Secondary | ICD-10-CM

## 2019-05-02 DIAGNOSIS — E782 Mixed hyperlipidemia: Secondary | ICD-10-CM

## 2019-05-20 DIAGNOSIS — G540 Brachial plexus disorders: Secondary | ICD-10-CM | POA: Diagnosis not present

## 2019-05-20 DIAGNOSIS — Z79899 Other long term (current) drug therapy: Secondary | ICD-10-CM | POA: Diagnosis not present

## 2019-05-20 DIAGNOSIS — Z79891 Long term (current) use of opiate analgesic: Secondary | ICD-10-CM | POA: Diagnosis not present

## 2019-05-20 DIAGNOSIS — F17210 Nicotine dependence, cigarettes, uncomplicated: Secondary | ICD-10-CM | POA: Diagnosis not present

## 2019-06-07 ENCOUNTER — Ambulatory Visit: Payer: Medicare Other | Admitting: Physician Assistant

## 2019-06-17 DIAGNOSIS — Z79899 Other long term (current) drug therapy: Secondary | ICD-10-CM | POA: Diagnosis not present

## 2019-06-17 DIAGNOSIS — Z79891 Long term (current) use of opiate analgesic: Secondary | ICD-10-CM | POA: Diagnosis not present

## 2019-06-17 DIAGNOSIS — F17210 Nicotine dependence, cigarettes, uncomplicated: Secondary | ICD-10-CM | POA: Diagnosis not present

## 2019-06-17 DIAGNOSIS — G540 Brachial plexus disorders: Secondary | ICD-10-CM | POA: Diagnosis not present

## 2019-06-29 ENCOUNTER — Other Ambulatory Visit: Payer: Self-pay | Admitting: *Deleted

## 2019-06-29 DIAGNOSIS — B001 Herpesviral vesicular dermatitis: Secondary | ICD-10-CM

## 2019-06-29 MED ORDER — VALACYCLOVIR HCL 1 G PO TABS: ORAL_TABLET | ORAL | 0 refills | Status: AC

## 2019-06-30 ENCOUNTER — Other Ambulatory Visit: Payer: Self-pay | Admitting: *Deleted

## 2019-06-30 MED ORDER — HYDROCHLOROTHIAZIDE 25 MG PO TABS: 25.0000 mg | ORAL_TABLET | Freq: Every day | ORAL | 0 refills | Status: AC

## 2019-07-25 ENCOUNTER — Other Ambulatory Visit: Payer: Self-pay | Admitting: *Deleted

## 2019-07-25 DIAGNOSIS — J309 Allergic rhinitis, unspecified: Secondary | ICD-10-CM

## 2019-07-25 MED ORDER — CETIRIZINE HCL 10 MG PO TABS: 10.0000 mg | ORAL_TABLET | Freq: Every day | ORAL | 0 refills | Status: AC

## 2019-08-24 ENCOUNTER — Telehealth: Payer: Self-pay | Admitting: *Deleted

## 2019-08-24 NOTE — Telephone Encounter (Signed)
Patient aware and verbalized understanding. °

## 2019-08-24 NOTE — Telephone Encounter (Signed)
I have literally never met this patient.  However, I think that Prilosec 68m daily is reasonable if he does not want to purchase BID dosing of the OTC.

## 2019-08-24 NOTE — Telephone Encounter (Signed)
PA came in today for pt - OMEPRAZOLE 20 MG BID (pt has been on this dose since 09/29/2017)  States will not approve more than one per day  Can we try and send in 40 mg one a day - to if that will be covered?

## 2019-09-19 ENCOUNTER — Other Ambulatory Visit: Payer: Self-pay | Admitting: *Deleted

## 2019-09-19 DIAGNOSIS — S8412XA Injury of peroneal nerve at lower leg level, left leg, initial encounter: Secondary | ICD-10-CM

## 2019-09-19 DIAGNOSIS — G540 Brachial plexus disorders: Secondary | ICD-10-CM

## 2019-09-20 ENCOUNTER — Other Ambulatory Visit: Payer: Self-pay | Admitting: *Deleted

## 2019-09-20 DIAGNOSIS — E782 Mixed hyperlipidemia: Secondary | ICD-10-CM

## 2019-09-20 MED ORDER — SIMVASTATIN 20 MG PO TABS: 20.0000 mg | ORAL_TABLET | Freq: Every day | ORAL | 0 refills | Status: AC

## 2019-09-20 MED ORDER — GABAPENTIN 800 MG PO TABS: 800.0000 mg | ORAL_TABLET | Freq: Three times a day (TID) | ORAL | 0 refills | Status: AC

## 2019-09-20 NOTE — Telephone Encounter (Signed)
Opened in error. Closing encounter.

## 2019-09-21 ENCOUNTER — Other Ambulatory Visit: Payer: Self-pay | Admitting: *Deleted

## 2019-09-21 DIAGNOSIS — G540 Brachial plexus disorders: Secondary | ICD-10-CM

## 2019-09-21 DIAGNOSIS — S8412XA Injury of peroneal nerve at lower leg level, left leg, initial encounter: Secondary | ICD-10-CM

## 2019-09-23 ENCOUNTER — Other Ambulatory Visit: Payer: Self-pay

## 2020-04-06 ENCOUNTER — Other Ambulatory Visit: Payer: Self-pay | Admitting: Family Medicine

## 2020-04-06 DIAGNOSIS — B001 Herpesviral vesicular dermatitis: Secondary | ICD-10-CM

## 2023-03-31 ENCOUNTER — Ambulatory Visit: Payer: Medicare HMO | Attending: Orthopedic Surgery | Admitting: Physical Therapy

## 2023-03-31 ENCOUNTER — Encounter: Payer: Self-pay | Admitting: Physical Therapy

## 2023-03-31 ENCOUNTER — Other Ambulatory Visit: Payer: Self-pay

## 2023-03-31 DIAGNOSIS — M25612 Stiffness of left shoulder, not elsewhere classified: Secondary | ICD-10-CM | POA: Insufficient documentation

## 2023-03-31 DIAGNOSIS — M25512 Pain in left shoulder: Secondary | ICD-10-CM | POA: Insufficient documentation

## 2023-03-31 NOTE — Therapy (Addendum)
 OUTPATIENT PHYSICAL THERAPY SHOULDER EVALUATION   Patient Name: Keith Robertson MRN: [id number] DOB:1965-11-11, 58 y.o., male Today's Date: 03/31/2023  END OF SESSION:  PT End of Session - 03/31/23 1215     Visit Number 1    Number of Visits 18    Date for PT Re-Evaluation 05/12/23    PT Start Time 1015    PT Stop Time 1100    PT Time Calculation (min) 45 min    Activity Tolerance Patient tolerated treatment well    Behavior During Therapy WFL for tasks assessed/performed             Past Medical History:  Diagnosis Date   Broken femur (HCC)    Broken leg    Broken neck (HCC)    Hypertension    Muscle, jerky movements (uncontrolled)    Past Surgical History:  Procedure Laterality Date   ANKLE SURGERY Right    BACK SURGERY     KNEE SURGERY Left    LEG SURGERY Left    Patient Active Problem List   Diagnosis Date Noted   Fever blister 01/26/2017   Sinusitis 01/26/2017   Pain management 04/18/2016   Closed displaced fracture of third cervical vertebra (HCC) 04/18/2016   Moderate persistent asthma without complication 04/16/2016   History of vertebral fracture 04/16/2016   Mixed hyperlipidemia 04/16/2016   Chronic allergic rhinitis 04/16/2016   Primary osteoarthritis involving multiple joints 04/16/2016   Body mass index 33.0-33.9, adult 10/22/2015   Peroneal nerve injury 10/18/2015   Nicotine dependence 12/14/2012   Subacromial impingement of left shoulder 11/08/2012   Brachial plexopathy 10/05/2012   Left shoulder pain 10/05/2012   REFERRING PROVIDER: Jones Broom MD  REFERRING DIAG: S/P right shoulder RCR, SAD  THERAPY DIAG:  Acute pain of left shoulder - Plan: PT plan of care cert/re-cert  Stiffness of left shoulder, not elsewhere classified - Plan: PT plan of care cert/re-cert  Rationale for Evaluation and Treatment: Rehabilitation  ONSET DATE: 03/03/23.    SUBJECTIVE:                                                                                                                                                                                       SUBJECTIVE STATEMENT: The patient presents to the clinic s/p right shoulder S/P RCR and SAD performed on 03/03/23.  He is wearing his sling today.  His pain at rest today is a 6/10 and much higher with movement out of sling.    PERTINENT HISTORY: See above.    PAIN:  Are you having pain? Yes: NPRS scale: 6/10. Pain location: Right shoulder. Pain description: Ache, throb. Aggravating factors: Movement.  Relieving factors: Rest.  PRECAUTIONS: Other: Per protocol.    WEIGHT BEARING RESTRICTIONS:  Avoid right UE weight bearing.  FALLS:  Has patient fallen in last 6 months? No  LIVING ENVIRONMENT: Lives with: lives with their spouse Lives in: House/apartment Has following equipment at home: None  OCCUPATION: Disabled.  PLOF: Independent  PATIENT GOALS:Move right shoulder without pain.    NEXT MD VISIT:   OBJECTIVE:  Note: Objective measures were completed at Evaluation unless otherwise noted.  PATIENT SURVEYS:  Quick Dash 61.36.  POSTURE: Guarded.  UPPER EXTREMITY ROM:   In supine:  Very gentle right shoulder passive flexion to 70 degrees and ER 15 degrees from neutral.    PALPATION:  Scope sites appear to be healing well.  His CC of pain is in the posterior shoulder region.                                                                                                                               TREATMENT DATE: Vasopneumtaic on low to patient's right  shoulder x 15 minutes with pillow between thorax and elbow.   PATIENT EDUCATION: Education details: Discussed following protocol.   Person educated: Patient Education method: Explanation Education comprehension: verbalized understanding  HOME EXERCISE PROGRAM:   ASSESSMENT:  CLINICAL IMPRESSION: The patient presents to OPPT s/p right shoulder RCR and SAD performed on 03/03/23.  He is currently  experiencing a great deal of pain and especially with gentle passive range of motion.  His incisional sites appear to be healing well.  He is tender to palpation in his posterior cuff region.  His right shoulder ROM is limited, especially ER.  His Quick DASH score is 61.36.  Patient will benefit from skilled physical therapy intervention to address pain and deficits.  OBJECTIVE IMPAIRMENTS: decreased activity tolerance, decreased ROM, and pain.   ACTIVITY LIMITATIONS: carrying, lifting, and reach over head  PARTICIPATION LIMITATIONS: meal prep, cleaning, laundry, and yard work  [facility] POTENTIAL: Good  CLINICAL DECISION MAKING: Stable/uncomplicated  EVALUATION COMPLEXITY: Low   GOALS: LONG TERM GOALS: Target date: 05/26/23  Ind with a HEP.  Goal status: INITIAL  2.  Active right shoulder flexion to 145 degrees so the patient can easily reach overhead.  Goal status: INITIAL  3.  Active ER to 70 degrees+ to allow for easily donning/doffing of apparel.  Goal status: INITIAL  4.  Perform ADL's with pain not > 3/10.:  Goal status: INITIAL    PLAN:  PT FREQUENCY:  2-3 times a week  PT DURATION: 6 weeks  PLANNED INTERVENTIONS: 97110-Therapeutic exercises, 97530- Therapeutic activity, [id number]- Neuromuscular re-education, 97535- Self Care, 09811- Manual therapy, B1478- Electrical stimulation (unattended), 97016- Vasopneumatic device, 97035- Ultrasound, Cryotherapy, and Moist heat  PLAN FOR NEXT SESSION: Progress per protocol.  Establish HEP (supine cane exercise to passively increase ER and countertop for passive flexion).  Vaso with pillow between right elbow and thorax.     Willona Phariss, [place], PT 03/31/2023,  12:38 PM

## 2023-04-03 ENCOUNTER — Ambulatory Visit: Payer: 59 | Admitting: Physical Therapy

## 2023-04-03 DIAGNOSIS — M25512 Pain in left shoulder: Secondary | ICD-10-CM | POA: Diagnosis not present

## 2023-04-03 DIAGNOSIS — M25612 Stiffness of left shoulder, not elsewhere classified: Secondary | ICD-10-CM

## 2023-04-03 NOTE — Therapy (Signed)
[name] OUTPATIENT PHYSICAL THERAPY SHOULDER TREATMENT Patient Name: Keith Robertson MRN: [id number] DOB:1965/03/16, 58 y.o., male Today's Date: 04/03/2023  END OF SESSION:  PT End of Session - 04/03/23 1039     Visit Number 2    Number of Visits 18    Date for PT Re-Evaluation 05/26/23    PT Start Time 0930    PT Stop Time 1015    PT Time Calculation (min) 45 min    Activity Tolerance Patient tolerated treatment well    Behavior During Therapy WFL for tasks assessed/performed              Past Medical History:  Diagnosis Date   Broken femur (HCC)    Broken leg    Broken neck (HCC)    Hypertension    Muscle, jerky movements (uncontrolled)    Past Surgical History:  Procedure Laterality Date   ANKLE SURGERY Right    BACK SURGERY     KNEE SURGERY Left    LEG SURGERY Left    Patient Active Problem List   Diagnosis Date Noted   Fever blister 01/26/2017   Sinusitis 01/26/2017   Pain management 04/18/2016   Closed displaced fracture of third cervical vertebra (HCC) 04/18/2016   Moderate persistent asthma without complication 04/16/2016   History of vertebral fracture 04/16/2016   Mixed hyperlipidemia 04/16/2016   Chronic allergic rhinitis 04/16/2016   Primary osteoarthritis involving multiple joints 04/16/2016   Body mass index 33.0-33.9, adult 10/22/2015   Peroneal nerve injury 10/18/2015   Nicotine dependence 12/14/2012   Subacromial impingement of left shoulder 11/08/2012   Brachial plexopathy 10/05/2012   Left shoulder pain 10/05/2012   REFERRING PROVIDER: Jones Broom MD  REFERRING DIAG: S/P right shoulder RCR, SAD  THERAPY DIAG:  Acute pain of left shoulder  Stiffness of left shoulder, not elsewhere classified  Rationale for Evaluation and Treatment: Rehabilitation  ONSET DATE: 03/03/23.    SUBJECTIVE:                                                                                                                                                                                       SUBJECTIVE STATEMENT: Doing better. PERTINENT HISTORY: See above.    PAIN:  Are you having pain? Yes: NPRS scale: 6/10. Pain location: Right shoulder. Pain description: Ache, throb. Aggravating factors: Movement. Relieving factors: Rest.  PRECAUTIONS: Other: Per protocol.    WEIGHT BEARING RESTRICTIONS:  Avoid right UE weight bearing.  FALLS:  Has patient fallen in last 6 months? No  LIVING ENVIRONMENT: Lives with: lives with their spouse Lives in: House/apartment Has following equipment at home: None  OCCUPATION: Disabled.  PLOF: Independent  PATIENT GOALS:Move right shoulder without pain.    NEXT MD VISIT:   OBJECTIVE:  Note: Objective measures were completed at Evaluation unless otherwise noted.  PATIENT SURVEYS:  Quick Dash 61.36.  POSTURE: Guarded.  UPPER EXTREMITY ROM:   In supine:  Very gentle right shoulder passive flexion to 70 degrees and ER 15 degrees from neutral.    PALPATION:  Scope sites appear to be healing well.  His CC of pain is in the posterior shoulder region.                                                                                                                               TREATMENT DATE: 04/02/22:  Gentle PROM x 23 minutes per protocol guidelines f/b Vasopneumtaic on low to patient's right  shoulder x 15 minutes with pillow between thorax and elbow.     PATIENT EDUCATION: Education details: See below.  Person educated: Patient Education method: Explanation, handout Education comprehension: verbalized understanding, demo HOME EXERCISE PROGRAM:  HOME EXERCISE PROGRAM Created by [place] Farhad Burleson Feb 21st, 2025 View at [email or web address] using code: [email or web address]  Page 1 of 1 1 Exercise WAND EXTERNAL ROTATION - SUPINE ER Lie on your back holding a cane or wand with both hands.  On the affected side, place a small rolled up towel or pillow under your elbow. Maintain approx. 90 degree bend at  the elbow with your arm approximately 30-45 degrees away from your side. GENTLE. PAIN-FREE Use your other arm to pull the wand/cane to rotate the affected arm back into a stretch. Hold and then return to starting position and then repeat. Repeat 10 Times Hold 15 Seconds Complete 1 Set Perform 4 Times a Day  ASSESSMENT:  CLINICAL IMPRESSION: Patient did well today with gentle PROM per protocol guidelines.  Instructed patient in supine cane stretch to improve ER which he performed with excellent technique.    OBJECTIVE IMPAIRMENTS: decreased activity tolerance, decreased ROM, and pain.   ACTIVITY LIMITATIONS: carrying, lifting, and reach over head  PARTICIPATION LIMITATIONS: meal prep, cleaning, laundry, and yard work  [facility] POTENTIAL: Good  CLINICAL DECISION MAKING: Stable/uncomplicated  EVALUATION COMPLEXITY: Low   GOALS: LONG TERM GOALS: Target date: 05/26/23  Ind with a HEP.  Goal status: INITIAL  2.  Active right shoulder flexion to 145 degrees so the patient can easily reach overhead.  Goal status: INITIAL  3.  Active ER to 70 degrees+ to allow for easily donning/doffing of apparel.  Goal status: INITIAL  4.  Perform ADL's with pain not > 3/10.:  Goal status: INITIAL    PLAN:  PT FREQUENCY:  2-3 times a week  PT DURATION: 6 weeks  PLANNED INTERVENTIONS: 97110-Therapeutic exercises, 97530- Therapeutic activity, [id number]- Neuromuscular re-education, 97535- Self Care, 82956- Manual therapy, O1308- Electrical stimulation (unattended), 97016- Vasopneumatic device, 97035- Ultrasound, Cryotherapy, and Moist heat  PLAN FOR NEXT SESSION: Progress per protocol.  Establish HEP (supine cane exercise to passively increase ER and countertop  for passive flexion).  Vaso with pillow between right elbow and thorax.     Elester Apodaca, [place], PT 04/03/2023, 10:40 AM

## 2023-04-07 ENCOUNTER — Ambulatory Visit: Payer: Medicare HMO | Admitting: *Deleted

## 2023-04-07 DIAGNOSIS — M25512 Pain in left shoulder: Secondary | ICD-10-CM

## 2023-04-07 DIAGNOSIS — M25612 Stiffness of left shoulder, not elsewhere classified: Secondary | ICD-10-CM

## 2023-04-07 NOTE — Therapy (Signed)
 [name] OUTPATIENT PHYSICAL THERAPY SHOULDER TREATMENT Patient Name: Keith Robertson MRN: [id number] DOB:1966-02-05, 58 y.o., male Today's Date: 04/07/2023  END OF SESSION:  PT End of Session - 04/07/23 1716     Visit Number 3    Number of Visits 18    Date for PT Re-Evaluation 05/26/23    PT Start Time 1015    PT Stop Time 1102    PT Time Calculation (min) 47 min              Past Medical History:  Diagnosis Date   Broken femur (HCC)    Broken leg    Broken neck (HCC)    Hypertension    Muscle, jerky movements (uncontrolled)    Past Surgical History:  Procedure Laterality Date   ANKLE SURGERY Right    BACK SURGERY     KNEE SURGERY Left    LEG SURGERY Left    Patient Active Problem List   Diagnosis Date Noted   Fever blister 01/26/2017   Sinusitis 01/26/2017   Pain management 04/18/2016   Closed displaced fracture of third cervical vertebra (HCC) 04/18/2016   Moderate persistent asthma without complication 04/16/2016   History of vertebral fracture 04/16/2016   Mixed hyperlipidemia 04/16/2016   Chronic allergic rhinitis 04/16/2016   Primary osteoarthritis involving multiple joints 04/16/2016   Body mass index 33.0-33.9, adult 10/22/2015   Peroneal nerve injury 10/18/2015   Nicotine dependence 12/14/2012   Subacromial impingement of left shoulder 11/08/2012   Brachial plexopathy 10/05/2012   Left shoulder pain 10/05/2012   REFERRING PROVIDER: Jones Broom MD  REFERRING DIAG: S/P right shoulder RCR, SAD  THERAPY DIAG:  Acute pain of left shoulder  Stiffness of left shoulder, not elsewhere classified  Rationale for Evaluation and Treatment: Rehabilitation  ONSET DATE: 03/03/23.    SUBJECTIVE:                                                                                                                                                                                      SUBJECTIVE STATEMENT: Doing better overall RT shldr PERTINENT HISTORY: See above.     PAIN:  Are you having pain? Yes: NPRS scale: 6/10. Pain location: Right shoulder. Pain description: Ache, throb. Aggravating factors: Movement. Relieving factors: Rest.  PRECAUTIONS: Other: Per protocol.    WEIGHT BEARING RESTRICTIONS:  Avoid right UE weight bearing.  FALLS:  Has patient fallen in last 6 months? No  LIVING ENVIRONMENT: Lives with: lives with their spouse Lives in: House/apartment Has following equipment at home: None  OCCUPATION: Disabled.  PLOF: Independent  PATIENT GOALS:Move right shoulder without pain.    NEXT MD VISIT:   OBJECTIVE:  Note: Objective measures were completed at Evaluation unless otherwise noted.  PATIENT SURVEYS:  Quick Dash 61.36.  POSTURE: Guarded.  UPPER EXTREMITY ROM:   In supine:  Very gentle right shoulder passive flexion to 70 degrees and ER 15 degrees from neutral.    PALPATION:  Scope sites appear to be healing well.  His CC of pain is in the posterior shoulder region.                                                                                                                               TREATMENT DATE:   04/06/22:   Gentle PROM x 26 minutes per protocol guidelines for flexion, ABD , and ER. With   Vasopneumtaic on low to patient's right  shoulder x 15 minutes with pillow between thorax and elbow.     PATIENT EDUCATION: Education details: See below.  Person educated: Patient Education method: Explanation, handout Education comprehension: verbalized understanding, demo HOME EXERCISE PROGRAM:  HOME EXERCISE PROGRAM Created by [place] [employer] Feb 21st, 2025 View at [email or web address] using code: [email or web address]  Page 1 of 1 1 Exercise WAND EXTERNAL ROTATION - SUPINE ER Lie on your back holding a cane or wand with both hands.  On the affected side, place a small rolled up towel or pillow under your elbow. Maintain approx. 90 degree bend at the elbow with your arm approximately 30-45 degrees away from  your side. GENTLE. PAIN-FREE Use your other arm to pull the wand/cane to rotate the affected arm back into a stretch. Hold and then return to starting position and then repeat. Repeat 10 Times Hold 15 Seconds Complete 1 Set Perform 4 Times a Day  ASSESSMENT:  CLINICAL IMPRESSION: Patient arrived today doing fairly well with RT shldr. He did well today with gentle PROM per protocol guidelines.  Reviewed patient in supine cane stretch to improve ER which he performed with excellent technique.    OBJECTIVE IMPAIRMENTS: decreased activity tolerance, decreased ROM, and pain.   ACTIVITY LIMITATIONS: carrying, lifting, and reach over head  PARTICIPATION LIMITATIONS: meal prep, cleaning, laundry, and yard work  [facility] POTENTIAL: Good  CLINICAL DECISION MAKING: Stable/uncomplicated  EVALUATION COMPLEXITY: Low   GOALS: LONG TERM GOALS: Target date: 05/26/23  Ind with a HEP.  Goal status: INITIAL  2.  Active right shoulder flexion to 145 degrees so the patient can easily reach overhead.  Goal status: INITIAL  3.  Active ER to 70 degrees+ to allow for easily donning/doffing of apparel.  Goal status: INITIAL  4.  Perform ADL's with pain not > 3/10.:  Goal status: INITIAL    PLAN:  PT FREQUENCY:  2-3 times a week  PT DURATION: 6 weeks  PLANNED INTERVENTIONS: 97110-Therapeutic exercises, 97530- Therapeutic activity, [id number]- Neuromuscular re-education, 97535- Self Care, 86578- Manual therapy, [id number]- Electrical stimulation (unattended), 97016- Vasopneumatic device, 97035- Ultrasound, Cryotherapy, and Moist heat  PLAN FOR NEXT SESSION: Progress per protocol.    Vaso with pillow between right  elbow and thorax.     Ruhani Umland,CHRIS, PTA 04/07/2023, 5:28 PM

## 2023-04-10 ENCOUNTER — Ambulatory Visit: Payer: Medicare HMO

## 2023-04-10 DIAGNOSIS — M25612 Stiffness of left shoulder, not elsewhere classified: Secondary | ICD-10-CM

## 2023-04-10 DIAGNOSIS — M25512 Pain in left shoulder: Secondary | ICD-10-CM | POA: Diagnosis not present

## 2023-04-10 NOTE — Therapy (Signed)
 [name] OUTPATIENT PHYSICAL THERAPY SHOULDER TREATMENT  Patient Name: Keith Robertson MRN: [id number] DOB:1965-03-08, 58 y.o., male Today's Date: 04/10/2023  END OF SESSION:  PT End of Session - 04/10/23 1014     Visit Number 4    Number of Visits 18    Date for PT Re-Evaluation 05/26/23    PT Start Time 1015    PT Stop Time 1104    PT Time Calculation (min) 49 min              Past Medical History:  Diagnosis Date   Broken femur (HCC)    Broken leg    Broken neck (HCC)    Hypertension    Muscle, jerky movements (uncontrolled)    Past Surgical History:  Procedure Laterality Date   ANKLE SURGERY Right    BACK SURGERY     KNEE SURGERY Left    LEG SURGERY Left    Patient Active Problem List   Diagnosis Date Noted   Fever blister 01/26/2017   Sinusitis 01/26/2017   Pain management 04/18/2016   Closed displaced fracture of third cervical vertebra (HCC) 04/18/2016   Moderate persistent asthma without complication 04/16/2016   History of vertebral fracture 04/16/2016   Mixed hyperlipidemia 04/16/2016   Chronic allergic rhinitis 04/16/2016   Primary osteoarthritis involving multiple joints 04/16/2016   Body mass index 33.0-33.9, adult 10/22/2015   Peroneal nerve injury 10/18/2015   Nicotine dependence 12/14/2012   Subacromial impingement of left shoulder 11/08/2012   Brachial plexopathy 10/05/2012   Left shoulder pain 10/05/2012   REFERRING PROVIDER: Jones Broom MD  REFERRING DIAG: S/P right shoulder RCR, SAD  THERAPY DIAG:  Acute pain of left shoulder  Stiffness of left shoulder, not elsewhere classified  Rationale for Evaluation and Treatment: Rehabilitation  ONSET DATE: 03/03/23.    SUBJECTIVE:                                                                                                                                                                                      SUBJECTIVE STATEMENT: Pt reports 7/10 right shoulder pain today.  Pt reports he  almost fell in the shower and he had to catch himself.    PERTINENT HISTORY: See above.    PAIN:  Are you having pain? Yes: NPRS scale: 7/10. Pain location: Right shoulder. Pain description: Ache, throb. Aggravating factors: Movement. Relieving factors: Rest.  PRECAUTIONS: Other: Per protocol.    WEIGHT BEARING RESTRICTIONS:  Avoid right UE weight bearing.  FALLS:  Has patient fallen in last 6 months? No  LIVING ENVIRONMENT: Lives with: lives with their spouse Lives in: House/apartment Has following equipment at home: None  OCCUPATION: Disabled.  PLOF: Independent  PATIENT GOALS:Move right shoulder without pain.    NEXT MD VISIT:   OBJECTIVE:  Note: Objective measures were completed at Evaluation unless otherwise noted.  PATIENT SURVEYS:  Quick Dash 61.36.  POSTURE: Guarded.  UPPER EXTREMITY ROM:   In supine:  Very gentle right shoulder passive flexion to 70 degrees and ER 15 degrees from neutral.    PALPATION:  Scope sites appear to be healing well.  His CC of pain is in the posterior shoulder region.                                                                                                                               TREATMENT DATE:                                     EXERCISE LOG  Exercise Repetitions and Resistance Comments  Pulleys 6 mins   Gentle PROM 17 mins flexion, ABD, and ER        Blank cell = exercise not performed today   Modalities  Date:  Unattended Estim: Shoulder, IFC 80-150 Hz, 20 mins, Pain and Tone Vaso: Shoulder, 34 degrees; low pressure, 20 mins, Pain and Edema    04/06/22:   Gentle PROM x 26 minutes per protocol guidelines for flexion, ABD , and ER. With   Vasopneumtaic on low to patient's right  shoulder x 15 minutes with pillow between thorax and elbow.     PATIENT EDUCATION: Education details: See below.  Person educated: Patient Education method: Explanation, handout Education comprehension: verbalized  understanding, demo HOME EXERCISE PROGRAM:  HOME EXERCISE PROGRAM Created by [place] [employer]legate Feb 21st, 2025 View at [email or web address] using code: [email or web address]  Page 1 of 1 1 Exercise WAND EXTERNAL ROTATION - SUPINE ER Lie on your back holding a cane or wand with both hands.  On the affected side, place a small rolled up towel or pillow under your elbow. Maintain approx. 90 degree bend at the elbow with your arm approximately 30-45 degrees away from your side. GENTLE. PAIN-FREE Use your other arm to pull the wand/cane to rotate the affected arm back into a stretch. Hold and then return to starting position and then repeat. Repeat 10 Times Hold 15 Seconds Complete 1 Set Perform 4 Times a Day  ASSESSMENT:  CLINICAL IMPRESSION: Pt arrives for today's treatment session reporting 7/10 right shoulder pain.  Pt reports that he slipped in the shower this morning and caught most of his weight with his right arm and he pain increased from 3/10 to 7/10.  Pt without sling donned today stating that MD cleared him yesterday.  Pt introduced to pulleys for AAROM today with good results.  PROM performed into flexion, abduction, and ER with gentle hold at end range.  Normal responses to estim and vaso noted upon removal.  Pt reported 3/10 right  shoulder pain at completion of today's treatment session.   OBJECTIVE IMPAIRMENTS: decreased activity tolerance, decreased ROM, and pain.   ACTIVITY LIMITATIONS: carrying, lifting, and reach over head  PARTICIPATION LIMITATIONS: meal prep, cleaning, laundry, and yard work  [facility] POTENTIAL: Good  CLINICAL DECISION MAKING: Stable/uncomplicated  EVALUATION COMPLEXITY: Low   GOALS: LONG TERM GOALS: Target date: 05/26/23  Ind with a HEP.  Goal status: INITIAL  2.  Active right shoulder flexion to 145 degrees so the patient can easily reach overhead.  Goal status: INITIAL  3.  Active ER to 70 degrees+ to allow for easily donning/doffing of apparel.  Goal  status: INITIAL  4.  Perform ADL's with pain not > 3/10.:  Goal status: INITIAL    PLAN:  PT FREQUENCY:  2-3 times a week  PT DURATION: 6 weeks  PLANNED INTERVENTIONS: 97110-Therapeutic exercises, 97530- Therapeutic activity, [id number]- Neuromuscular re-education, 97535- Self Care, 11914- Manual therapy, [id number]- Electrical stimulation (unattended), 97016- Vasopneumatic device, 97035- Ultrasound, Cryotherapy, and Moist heat  PLAN FOR NEXT SESSION: Progress per protocol.    Vaso with pillow between right elbow and thorax.     Newman Pies, PTA 04/10/2023, 11:05 AM

## 2023-04-17 ENCOUNTER — Encounter: Payer: 59 | Admitting: Physical Therapy

## 2023-04-28 ENCOUNTER — Ambulatory Visit: Attending: Orthopedic Surgery | Admitting: *Deleted

## 2023-04-28 ENCOUNTER — Encounter: Payer: Self-pay | Admitting: *Deleted

## 2023-04-28 DIAGNOSIS — M25512 Pain in left shoulder: Secondary | ICD-10-CM | POA: Diagnosis present

## 2023-04-28 DIAGNOSIS — M25612 Stiffness of left shoulder, not elsewhere classified: Secondary | ICD-10-CM | POA: Insufficient documentation

## 2023-04-28 NOTE — Therapy (Signed)
 [facility] OUTPATIENT PHYSICAL THERAPY SHOULDER TREATMENT  Patient Name: Keith Robertson MRN: [id number] DOB:1965-07-17, 58 y.o., male Today's Date: 04/28/2023  END OF SESSION:  PT End of Session - 04/28/23 1022     Visit Number 5    Number of Visits 18    Date for PT Re-Evaluation 05/26/23    PT Start Time 1015    PT Stop Time 1105    PT Time Calculation (min) 50 min              Past Medical History:  Diagnosis Date   Broken femur (HCC)    Broken leg    Broken neck (HCC)    Hypertension    Muscle, jerky movements (uncontrolled)    Past Surgical History:  Procedure Laterality Date   ANKLE SURGERY Right    BACK SURGERY     KNEE SURGERY Left    LEG SURGERY Left    Patient Active Problem List   Diagnosis Date Noted   Fever blister 01/26/2017   Sinusitis 01/26/2017   Pain management 04/18/2016   Closed displaced fracture of third cervical vertebra (HCC) 04/18/2016   Moderate persistent asthma without complication 04/16/2016   History of vertebral fracture 04/16/2016   Mixed hyperlipidemia 04/16/2016   Chronic allergic rhinitis 04/16/2016   Primary osteoarthritis involving multiple joints 04/16/2016   Body mass index 33.0-33.9, adult 10/22/2015   Peroneal nerve injury 10/18/2015   Nicotine dependence 12/14/2012   Subacromial impingement of left shoulder 11/08/2012   Brachial plexopathy 10/05/2012   Left shoulder pain 10/05/2012   REFERRING PROVIDER: Jones Broom MD  REFERRING DIAG: S/P right shoulder RCR, SAD  THERAPY DIAG:  Acute pain of left shoulder  Stiffness of left shoulder, not elsewhere classified  Rationale for Evaluation and Treatment: Rehabilitation  ONSET DATE: 03/03/23.    SUBJECTIVE:                                                                                                                                                                                      SUBJECTIVE STATEMENT: Pt reports 5/10 right shoulder pain today. Had Covid last week   To MD 05-16-23  PERTINENT HISTORY: See above.    PAIN:  Are you having pain? Yes: NPRS scale: 5/10. Pain location: Right shoulder. Pain description: Ache, throb. Aggravating factors: Movement. Relieving factors: Rest.  PRECAUTIONS: Other: Per protocol.    WEIGHT BEARING RESTRICTIONS:  Avoid right UE weight bearing.  FALLS:  Has patient fallen in last 6 months? No  LIVING ENVIRONMENT: Lives with: lives with their spouse Lives in: House/apartment Has following equipment at home: None  OCCUPATION: Disabled.  PLOF: Independent  PATIENT GOALS:Move right  shoulder without pain.    NEXT MD VISIT:   OBJECTIVE:  Note: Objective measures were completed at Evaluation unless otherwise noted.  PATIENT SURVEYS:  Quick Dash 61.36.  POSTURE: Guarded.  UPPER EXTREMITY ROM:   In supine:  Very gentle right shoulder passive flexion to 70 degrees and ER 15 degrees from neutral.    PALPATION:  Scope sites appear to be healing well.  His CC of pain is in the posterior shoulder region.                                                                                                                               TREATMENT DATE:  04/28/23                                     EXERCISE LOG    RT shldr    8 weeks 3-18  Exercise Repetitions and Resistance Comments  Pulleys 6 mins   Gentle Manual PROM 20 mins flexion, ABD, and ER   UE ranger     Blank cell = exercise not performed today   Modalities  Date:  Unattended Estim: Shoulder,  ,   mins, Pain and Tone Vaso: Shoulder, 34 degrees; low pressure, 20 mins, Pain and Edema    04/06/22:   Gentle PROM x 26 minutes per protocol guidelines for flexion, ABD , and ER. With   Vasopneumtaic on low to patient's right  shoulder x 15 minutes with pillow between thorax and elbow.     PATIENT EDUCATION: Education details: See below.  Person educated: Patient Education method: Explanation, handout Education comprehension: verbalized  understanding, demo HOME EXERCISE PROGRAM:  HOME EXERCISE PROGRAM Created by [place] Applegate Feb 21st, 2025 View at [email or web address] using code: [email or web address]  Page 1 of 1 1 Exercise WAND EXTERNAL ROTATION - SUPINE ER Lie on your back holding a cane or wand with both hands.  On the affected side, place a small rolled up towel or pillow under your elbow. Maintain approx. 90 degree bend at the elbow with your arm approximately 30-45 degrees away from your side. GENTLE. PAIN-FREE Use your other arm to pull the wand/cane to rotate the affected arm back into a stretch. Hold and then return to starting position and then repeat. Repeat 10 Times Hold 15 Seconds Complete 1 Set Perform 4 Times a Day  ASSESSMENT:  CLINICAL IMPRESSION: Pt arrives for today's treatment session reporting 5/10 right shoulder pain and doing better than last Rx. Pt did well with AAROM as well as manual PROM for RT shldr. Pain was less after aggravating it last week and should be able to progress AAROM exs next visit.    OBJECTIVE IMPAIRMENTS: decreased activity tolerance, decreased ROM, and pain.   ACTIVITY LIMITATIONS: carrying, lifting, and reach over head  PARTICIPATION LIMITATIONS: meal prep, cleaning, laundry, and yard work  [facility] POTENTIAL: Good  CLINICAL  DECISION MAKING: Stable/uncomplicated  EVALUATION COMPLEXITY: Low   GOALS: LONG TERM GOALS: Target date: 05/26/23  Ind with a HEP.  Goal status: INITIAL  2.  Active right shoulder flexion to 145 degrees so the patient can easily reach overhead.  Goal status: INITIAL  3.  Active ER to 70 degrees+ to allow for easily donning/doffing of apparel.  Goal status: INITIAL  4.  Perform ADL's with pain not > 3/10.:  Goal status: INITIAL    PLAN:  PT FREQUENCY:  2-3 times a week  PT DURATION: 6 weeks  PLANNED INTERVENTIONS: 97110-Therapeutic exercises, 97530- Therapeutic activity, [id number]- Neuromuscular re-education, 97535- Self Care, [id number]-  Manual therapy, [id number]- Electrical stimulation (unattended), 97016- Vasopneumatic device, 97035- Ultrasound, Cryotherapy, and Moist heat  PLAN FOR NEXT SESSION: Progress per protocol.    Vaso with pillow between right elbow and thorax.     Harlow Carrizales,CHRIS, PTA 04/28/2023, 11:15 AM

## 2023-05-01 ENCOUNTER — Ambulatory Visit

## 2023-05-01 DIAGNOSIS — M25612 Stiffness of left shoulder, not elsewhere classified: Secondary | ICD-10-CM

## 2023-05-01 DIAGNOSIS — M25512 Pain in left shoulder: Secondary | ICD-10-CM | POA: Diagnosis not present

## 2023-05-01 NOTE — Therapy (Signed)
 [name] OUTPATIENT PHYSICAL THERAPY SHOULDER TREATMENT  Patient Name: Keith Robertson MRN: [id number] DOB:28-Feb-1965, 58 y.o., male Today's Date: 05/01/2023  END OF SESSION:  PT End of Session - 05/01/23 1017     Visit Number 6    Number of Visits 18    Date for PT Re-Evaluation 05/26/23    PT Start Time 1015    PT Stop Time 1111    PT Time Calculation (min) 56 min              Past Medical History:  Diagnosis Date   Broken femur (HCC)    Broken leg    Broken neck (HCC)    Hypertension    Muscle, jerky movements (uncontrolled)    Past Surgical History:  Procedure Laterality Date   ANKLE SURGERY Right    BACK SURGERY     KNEE SURGERY Left    LEG SURGERY Left    Patient Active Problem List   Diagnosis Date Noted   Fever blister 01/26/2017   Sinusitis 01/26/2017   Pain management 04/18/2016   Closed displaced fracture of third cervical vertebra (HCC) 04/18/2016   Moderate persistent asthma without complication 04/16/2016   History of vertebral fracture 04/16/2016   Mixed hyperlipidemia 04/16/2016   Chronic allergic rhinitis 04/16/2016   Primary osteoarthritis involving multiple joints 04/16/2016   Body mass index 33.0-33.9, adult 10/22/2015   Peroneal nerve injury 10/18/2015   Nicotine dependence 12/14/2012   Subacromial impingement of left shoulder 11/08/2012   Brachial plexopathy 10/05/2012   Left shoulder pain 10/05/2012   REFERRING PROVIDER: Jones Broom MD  REFERRING DIAG: S/P right shoulder RCR, SAD  THERAPY DIAG:  Acute pain of left shoulder  Stiffness of left shoulder, not elsewhere classified  Rationale for Evaluation and Treatment: Rehabilitation  ONSET DATE: 03/03/23.    SUBJECTIVE:                                                                                                                                                                                      SUBJECTIVE STATEMENT: Pt reports 5/10 right shoulder pain today. Had Covid last week   To MD 05-16-23  PERTINENT HISTORY: See above.    PAIN:  Are you having pain? Yes: NPRS scale: 5/10. Pain location: Right shoulder. Pain description: Ache, throb. Aggravating factors: Movement. Relieving factors: Rest.  PRECAUTIONS: Other: Per protocol.    WEIGHT BEARING RESTRICTIONS:  Avoid right UE weight bearing.  FALLS:  Has patient fallen in last 6 months? No  LIVING ENVIRONMENT: Lives with: lives with their spouse Lives in: House/apartment Has following equipment at home: None  OCCUPATION: Disabled.  PLOF: Independent  PATIENT GOALS:Move right  shoulder without pain.    NEXT MD VISIT:   OBJECTIVE:  Note: Objective measures were completed at Evaluation unless otherwise noted.  PATIENT SURVEYS:  Quick Dash 61.36.  POSTURE: Guarded.  UPPER EXTREMITY ROM:   In supine:  Very gentle right shoulder passive flexion to 70 degrees and ER 15 degrees from neutral.    PALPATION:  Scope sites appear to be healing well.  His CC of pain is in the posterior shoulder region.                                                                                                                               TREATMENT DATE:    04/28/23             EXERCISE LOG    RT shldr    8 weeks 3-18  Exercise Repetitions and Resistance Comments  Pulleys 6 mins   Gentle Manual PROM Flex; ER; and ABD   UE ranger Flex/ext; CW and CCW circles x 3 mins each   Wall Ladder 5 reps (max 27)   AA Flexion Cane x 15 reps   AA Chest Press Cane x 15 reps   AA Overhead Press Cane x 15 reps    Blank cell = exercise not performed today   Modalities  Date:  Unattended Estim: Shoulder, IFC 80-150 Hz, 15 mins, Pain and Tone Vaso: Shoulder, 34 degrees; low pressure, 15 mins, Pain and Edema    04/06/22:   Gentle PROM x 26 minutes per protocol guidelines for flexion, ABD , and ER. With   Vasopneumtaic on low to patient's right  shoulder x 15 minutes with pillow between thorax and elbow.   PATIENT  EDUCATION: Education details: See below.  Person educated: Patient Education method: Explanation, handout Education comprehension: verbalized understanding, demo HOME EXERCISE PROGRAM:  HOME EXERCISE PROGRAM Created by [place] [employer] Feb 21st, 2025 View at [email or web address] using code: [email or web address]  Page 1 of 1 1 Exercise WAND EXTERNAL ROTATION - SUPINE ER Lie on your back holding a cane or wand with both hands.  On the affected side, place a small rolled up towel or pillow under your elbow. Maintain approx. 90 degree bend at the elbow with your arm approximately 30-45 degrees away from your side. GENTLE. PAIN-FREE Use your other arm to pull the wand/cane to rotate the affected arm back into a stretch. Hold and then return to starting position and then repeat. Repeat 10 Times Hold 15 Seconds Complete 1 Set Perform 4 Times a Day  ASSESSMENT:  CLINICAL IMPRESSION: Pt arrives for today's treatment session reporting 3/10 right shoulder pain.  Pt introduced to UE ranger today, preforming flex/ext, CW and CCW circles with good results.  Pt able to reach max # of 27 on the wall ladder with cues to avoid pushing too far.  Pt also introduced to AA cane exercises today in flexion, chest press, and over head press.  UE ranger and AA exercises  added to HEP.  PROM into flexion, ER, and abduction with gentle hold at end range.  Normal responses to estim and vaso noted upon removal.  Pt reported decreased right shoulder pain at completion of today's treatment session.     OBJECTIVE IMPAIRMENTS: decreased activity tolerance, decreased ROM, and pain.   ACTIVITY LIMITATIONS: carrying, lifting, and reach over head  PARTICIPATION LIMITATIONS: meal prep, cleaning, laundry, and yard work  [facility] POTENTIAL: Good  CLINICAL DECISION MAKING: Stable/uncomplicated  EVALUATION COMPLEXITY: Low   GOALS: LONG TERM GOALS: Target date: 05/26/23  Ind with a HEP.  Goal status: IN PROGRESS  2.  Active  right shoulder flexion to 145 degrees so the patient can easily reach overhead.  Goal status: IN PROGRESS  3.  Active ER to 70 degrees+ to allow for easily donning/doffing of apparel.  Goal status: IN PROGRESS  4.  Perform ADL's with pain not > 3/10.:  Goal status: IN PROGRESS  PLAN:  PT FREQUENCY:  2-3 times a week  PT DURATION: 6 weeks  PLANNED INTERVENTIONS: 97110-Therapeutic exercises, 97530- Therapeutic activity, [id number]- Neuromuscular re-education, 97535- Self Care, 11914- Manual therapy, [id number]- Electrical stimulation (unattended), 97016- Vasopneumatic device, 97035- Ultrasound, Cryotherapy, and Moist heat  PLAN FOR NEXT SESSION: Progress per protocol.    Vaso with pillow between right elbow and thorax.    Newman Pies, PTA 05/01/2023, 11:12 AM

## 2023-05-04 ENCOUNTER — Ambulatory Visit

## 2023-05-04 DIAGNOSIS — M25512 Pain in left shoulder: Secondary | ICD-10-CM

## 2023-05-04 DIAGNOSIS — M25612 Stiffness of left shoulder, not elsewhere classified: Secondary | ICD-10-CM

## 2023-05-04 NOTE — Therapy (Signed)
 [name]PATIENT PHYSICAL THERAPY SHOULDER TREATMENT  Patient Name: Keith Robertson MRN: [id number] DOB:1965-08-03, 58 y.o., male Today's Date: 05/04/2023  END OF SESSION:  PT End of Session - 05/04/23 0805     Visit Number 7    Number of Visits 18    Date for PT Re-Evaluation 05/26/23    PT Start Time 0803    PT Stop Time 0857    PT Time Calculation (min) 54 min              Past Medical History:  Diagnosis Date   Broken femur (HCC)    Broken leg    Broken neck (HCC)    Hypertension    Muscle, jerky movements (uncontrolled)    Past Surgical History:  Procedure Laterality Date   ANKLE SURGERY Right    BACK SURGERY     KNEE SURGERY Left    LEG SURGERY Left    Patient Active Problem List   Diagnosis Date Noted   Fever blister 01/26/2017   Sinusitis 01/26/2017   Pain management 04/18/2016   Closed displaced fracture of third cervical vertebra (HCC) 04/18/2016   Moderate persistent asthma without complication 04/16/2016   History of vertebral fracture 04/16/2016   Mixed hyperlipidemia 04/16/2016   Chronic allergic rhinitis 04/16/2016   Primary osteoarthritis involving multiple joints 04/16/2016   Body mass index 33.0-33.9, adult 10/22/2015   Peroneal nerve injury 10/18/2015   Nicotine dependence 12/14/2012   Subacromial impingement of left shoulder 11/08/2012   Brachial plexopathy 10/05/2012   Left shoulder pain 10/05/2012   REFERRING PROVIDER: Jones Broom MD  REFERRING DIAG: S/P right shoulder RCR, SAD  THERAPY DIAG:  Acute pain of left shoulder  Stiffness of left shoulder, not elsewhere classified  Rationale for Evaluation and Treatment: Rehabilitation  ONSET DATE: 03/03/23.    SUBJECTIVE:                                                                                                                                                                                      SUBJECTIVE STATEMENT: Pt reports 2/10 right shoulder pain today.   PERTINENT  HISTORY: See above.    PAIN:  Are you having pain? Yes: NPRS scale: 2/10. Pain location: Right shoulder. Pain description: Ache, throb. Aggravating factors: Movement. Relieving factors: Rest.  PRECAUTIONS: Other: Per protocol.    WEIGHT BEARING RESTRICTIONS:  Avoid right UE weight bearing.  FALLS:  Has patient fallen in last 6 months? No  LIVING ENVIRONMENT: Lives with: lives with their spouse Lives in: House/apartment Has following equipment at home: None  OCCUPATION: Disabled.  PLOF: Independent  PATIENT GOALS:Move right shoulder without pain.    NEXT  MD VISIT:   OBJECTIVE:  Note: Objective measures were completed at Evaluation unless otherwise noted.  PATIENT SURVEYS:  Quick Dash 61.36.  POSTURE: Guarded.  UPPER EXTREMITY ROM:   In supine:  Very gentle right shoulder passive flexion to 70 degrees and ER 15 degrees from neutral.    PALPATION:  Scope sites appear to be healing well.  His CC of pain is in the posterior shoulder region.                                                                                                                               TREATMENT DATE:    05/04/23             EXERCISE LOG    RT shldr    8 weeks 3-18  Exercise Repetitions and Resistance Comments  Pulleys 6 mins   Gentle Manual PROM Flex; ER; and ABD   UE ranger Flex/ext; CW and CCW circles x 3 mins each   Wall Ladder 5 reps (max 28)   AA Flexion Cane 2 sets of 20 reps   AA Chest Press [place] 2 sets of 20 reps   AA Overhead Press [place] 2 sets of 20 reps    Blank cell = exercise not performed today   Modalities  Date:  Unattended Estim: Shoulder, IFC 80-150 Hz, 15 mins, Pain and Tone Vaso: Shoulder, 34 degrees; low pressure, 15 mins, Pain and Edema    04/06/22:   Gentle PROM x 26 minutes per protocol guidelines for flexion, ABD , and ER. With   Vasopneumtaic on low to patient's right  shoulder x 15 minutes with pillow between thorax and elbow.   PATIENT  EDUCATION: Education details: See below.  Person educated: Patient Education method: Explanation, handout Education comprehension: verbalized understanding, demo HOME EXERCISE PROGRAM:  HOME EXERCISE PROGRAM Created by [place] [employer] Feb 21st, 2025 View at [email or web address] using code: [email or web address]  Page 1 of 1 1 Exercise WAND EXTERNAL ROTATION - SUPINE ER Lie on your back holding a cane or wand with both hands.  On the affected side, place a small rolled up towel or pillow under your elbow. Maintain approx. 90 degree bend at the elbow with your arm approximately 30-45 degrees away from your side. GENTLE. PAIN-FREE Use your other arm to pull the wand/cane to rotate the affected arm back into a stretch. Hold and then return to starting position and then repeat. Repeat 10 Times Hold 15 Seconds Complete 1 Set Perform 4 Times a Day  ASSESSMENT:  CLINICAL IMPRESSION: Pt arrives for today's treatment session reporting 2/10 right shoulder pain.   Pt reports that his shoulder felt good for two days after last treatment session.  Pt able to reach number 28 on the wall ladder today, advancing one number from the first time performing.  Pt also able to tolerate increased reps with all AA exercises today without discomfort, but does report fatigue.  PROM performed into flexion,  ER, and abduction with gentle hold at end range.  Normal responses to estim and vaso noted upon removal.  Pt reported very minimal pain at completion of today's treatment session.  OBJECTIVE IMPAIRMENTS: decreased activity tolerance, decreased ROM, and pain.   ACTIVITY LIMITATIONS: carrying, lifting, and reach over head  PARTICIPATION LIMITATIONS: meal prep, cleaning, laundry, and yard work  [facility] POTENTIAL: Good  CLINICAL DECISION MAKING: Stable/uncomplicated  EVALUATION COMPLEXITY: Low   GOALS: LONG TERM GOALS: Target date: 05/26/23  Ind with a HEP.  Goal status: IN PROGRESS  2.  Active right shoulder  flexion to 145 degrees so the patient can easily reach overhead.  Goal status: IN PROGRESS  3.  Active ER to 70 degrees+ to allow for easily donning/doffing of apparel.  Goal status: IN PROGRESS  4.  Perform ADL's with pain not > 3/10.:  Goal status: IN PROGRESS  PLAN:  PT FREQUENCY:  2-3 times a week  PT DURATION: 6 weeks  PLANNED INTERVENTIONS: 97110-Therapeutic exercises, 97530- Therapeutic activity, [id number]- Neuromuscular re-education, 97535- Self Care, 16109- Manual therapy, U0454- Electrical stimulation (unattended), 97016- Vasopneumatic device, 97035- Ultrasound, Cryotherapy, and Moist heat  PLAN FOR NEXT SESSION: Progress per protocol.    Vaso with pillow between right elbow and thorax.    Newman Pies, PTA 05/04/2023, 8:58 AM

## 2023-05-08 ENCOUNTER — Encounter: Admitting: Physical Therapy

## 2023-05-22 ENCOUNTER — Ambulatory Visit: Attending: Orthopedic Surgery | Admitting: Physical Therapy

## 2023-05-22 DIAGNOSIS — M25612 Stiffness of left shoulder, not elsewhere classified: Secondary | ICD-10-CM | POA: Diagnosis present

## 2023-05-22 DIAGNOSIS — M25512 Pain in left shoulder: Secondary | ICD-10-CM | POA: Diagnosis present

## 2023-05-22 NOTE — Therapy (Signed)
 OUTPATIENT PHYSICAL THERAPY SHOULDER TREATMENT  Patient Name: Keith Robertson MRN: [id number] DOB:12/20/1965, 58 y.o., male Today's Date: 05/22/2023  END OF SESSION:  PT End of Session - 05/22/23 0920     Visit Number 8    Number of Visits 18    Date for PT Re-Evaluation 05/26/23    PT Start Time 0845    PT Stop Time 0940    PT Time Calculation (min) 55 min    Activity Tolerance Patient tolerated treatment well    Behavior During Therapy WFL for tasks assessed/performed               Past Medical History:  Diagnosis Date   Broken femur (HCC)    Broken leg    Broken neck (HCC)    Hypertension    Muscle, jerky movements (uncontrolled)    Past Surgical History:  Procedure Laterality Date   ANKLE SURGERY Right    BACK SURGERY     KNEE SURGERY Left    LEG SURGERY Left    Patient Active Problem List   Diagnosis Date Noted   Fever blister 01/26/2017   Sinusitis 01/26/2017   Pain management 04/18/2016   Closed displaced fracture of third cervical vertebra (HCC) 04/18/2016   Moderate persistent asthma without complication 04/16/2016   History of vertebral fracture 04/16/2016   Mixed hyperlipidemia 04/16/2016   Chronic allergic rhinitis 04/16/2016   Primary osteoarthritis involving multiple joints 04/16/2016   Body mass index 33.0-33.9, adult 10/22/2015   Peroneal nerve injury 10/18/2015   Nicotine dependence 12/14/2012   Subacromial impingement of left shoulder 11/08/2012   Brachial plexopathy 10/05/2012   Left shoulder pain 10/05/2012   REFERRING PROVIDER: Jones Broom MD  REFERRING DIAG: S/P right shoulder RCR, SAD  THERAPY DIAG:  Acute pain of left shoulder  Stiffness of left shoulder, not elsewhere classified  Rationale for Evaluation and Treatment: Rehabilitation  ONSET DATE: 03/03/23.    SUBJECTIVE:                                                                                                                                                                                       SUBJECTIVE STATEMENT: Pain about a 4 "inside."  Doctor pleased wit progress.  PERTINENT HISTORY: See above.    PAIN:  Are you having pain? Yes: NPRS scale: 2/10. Pain location: Right shoulder. Pain description: Ache, throb. Aggravating factors: Movement. Relieving factors: Rest.  PRECAUTIONS: Other: Per protocol.    WEIGHT BEARING RESTRICTIONS:  Avoid right UE weight bearing.  FALLS:  Has patient fallen in last 6 months? No  LIVING ENVIRONMENT: Lives with: lives with their spouse Lives in: House/apartment Has following  equipment at home: None  OCCUPATION: Disabled.  PLOF: Independent  PATIENT GOALS:Move right shoulder without pain.    NEXT MD VISIT:   OBJECTIVE:  Note: Objective measures were completed at Evaluation unless otherwise noted.  PATIENT SURVEYS:  Quick Dash 61.36.  POSTURE: Guarded.  UPPER EXTREMITY ROM:   In supine:  Very gentle right shoulder passive flexion to 70 degrees and ER 15 degrees from neutral.    PALPATION:  Scope sites appear to be healing well.  His CC of pain is in the posterior shoulder region.                                                                                                                               TREATMENT DATE:    05/22/23:                                       EXERCISE LOG  Exercise Repetitions and Resistance Comments  AA UBE 8 mins at 120 RPM's.   UE Ranger Multi-directional x 5 minutes   IR/ER With yellow theraband to fatigue x 2.           In supine:  LLLDS into right shoulder ER x 5 minutes f/b Vasopneumatic on low and low-level IFC at 80-150 Hz on 40% scan x 20 minutes.    05/04/23             EXERCISE LOG    RT shldr    8 weeks 3-18  Exercise Repetitions and Resistance Comments  Pulleys 6 mins   Gentle Manual PROM Flex; ER; and ABD   UE ranger Flex/ext; CW and CCW circles x 3 mins each   Wall Ladder 5 reps (max 28)   AA Flexion Cane 2 sets of 20 reps   AA  Chest Press [place] 2 sets of 20 reps   AA Overhead Press [place] 2 sets of 20 reps    Blank cell = exercise not performed today   Modalities  Date:  Unattended Estim: Shoulder, IFC 80-150 Hz, 15 mins, Pain and Tone Vaso: Shoulder, 34 degrees; low pressure, 15 mins, Pain and Edema    04/06/22:   Gentle PROM x 26 minutes per protocol guidelines for flexion, ABD , and ER. With   Vasopneumtaic on low to patient's right  shoulder x 15 minutes with pillow between thorax and elbow.   PATIENT EDUCATION: Education details: Yellow theraband IR/ER. Person educated: Patient Education method: Explanation, handout,  Education comprehension: verbalized understanding, demo HOME EXERCISE PROGRAM: As above (05/22/23).    ASSESSMENT:  CLINICAL IMPRESSION: Patient's pain-level upon arrival to clinic a 4/10 and feeling pain "inside."  He states his surgeon was pleased with his progress.  The patient states he has been using his home pulley system twice a day.  Added right shoulder ER/IR with yellow theraband.  He performed with excellent  technique and no increased pain.  Handout and yellow theraband provided to patient.    OBJECTIVE IMPAIRMENTS: decreased activity tolerance, decreased ROM, and pain.   ACTIVITY LIMITATIONS: carrying, lifting, and reach over head  PARTICIPATION LIMITATIONS: meal prep, cleaning, laundry, and yard work  [facility] POTENTIAL: Good  CLINICAL DECISION MAKING: Stable/uncomplicated  EVALUATION COMPLEXITY: Low   GOALS: LONG TERM GOALS: Target date: 05/26/23  Ind with a HEP.  Goal status: IN PROGRESS  2.  Active right shoulder flexion to 145 degrees so the patient can easily reach overhead.  Goal status: IN PROGRESS  3.  Active ER to 70 degrees+ to allow for easily donning/doffing of apparel.  Goal status: IN PROGRESS  4.  Perform ADL's with pain not > 3/10.:  Goal status: IN PROGRESS  PLAN:  PT FREQUENCY:  2-3 times a week  PT DURATION: 6 weeks  PLANNED  INTERVENTIONS: 97110-Therapeutic exercises, 97530- Therapeutic activity, [id number]- Neuromuscular re-education, 97535- Self Care, 95621- Manual therapy, [id number]- Electrical stimulation (unattended), 97016- Vasopneumatic device, 97035- Ultrasound, Cryotherapy, and Moist heat  PLAN FOR NEXT SESSION: Progress per protocol.    Vaso with pillow between right elbow and thorax.    Keyaira Clapham, [place], PT 05/22/2023, 9:41 AM

## 2023-05-26 ENCOUNTER — Ambulatory Visit

## 2023-05-26 DIAGNOSIS — M25612 Stiffness of left shoulder, not elsewhere classified: Secondary | ICD-10-CM

## 2023-05-26 DIAGNOSIS — M25512 Pain in left shoulder: Secondary | ICD-10-CM

## 2023-05-26 NOTE — Therapy (Signed)
 OUTPATIENT PHYSICAL THERAPY SHOULDER TREATMENT  Patient Name: Keith Robertson MRN: [id number] DOB:Jun 10, 1965, 58 y.o., male Today's Date: 05/26/2023  END OF SESSION:  PT End of Session - 05/26/23 0802     Visit Number 9    Number of Visits 18    Date for PT Re-Evaluation 05/26/23    PT Start Time 0800    PT Stop Time 0859    PT Time Calculation (min) 59 min    Activity Tolerance Patient tolerated treatment well    Behavior During Therapy WFL for tasks assessed/performed               Past Medical History:  Diagnosis Date   Broken femur (HCC)    Broken leg    Broken neck (HCC)    Hypertension    Muscle, jerky movements (uncontrolled)    Past Surgical History:  Procedure Laterality Date   ANKLE SURGERY Right    BACK SURGERY     KNEE SURGERY Left    LEG SURGERY Left    Patient Active Problem List   Diagnosis Date Noted   Fever blister 01/26/2017   Sinusitis 01/26/2017   Pain management 04/18/2016   Closed displaced fracture of third cervical vertebra (HCC) 04/18/2016   Moderate persistent asthma without complication 04/16/2016   History of vertebral fracture 04/16/2016   Mixed hyperlipidemia 04/16/2016   Chronic allergic rhinitis 04/16/2016   Primary osteoarthritis involving multiple joints 04/16/2016   Body mass index 33.0-33.9, adult 10/22/2015   Peroneal nerve injury 10/18/2015   Nicotine dependence 12/14/2012   Subacromial impingement of left shoulder 11/08/2012   Brachial plexopathy 10/05/2012   Left shoulder pain 10/05/2012   REFERRING PROVIDER: Sammye Cristal MD  REFERRING DIAG: S/P right shoulder RCR, SAD  THERAPY DIAG:  Acute pain of left shoulder  Stiffness of left shoulder, not elsewhere classified  Rationale for Evaluation and Treatment: Rehabilitation  ONSET DATE: 03/03/23.    SUBJECTIVE:                                                                                                                                                                                       SUBJECTIVE STATEMENT: Pt reports 2/10 right shoulder pain.  PERTINENT HISTORY: See above.    PAIN:  Are you having pain? Yes: NPRS scale: 2/10. Pain location: Right shoulder. Pain description: Ache, throb. Aggravating factors: Movement. Relieving factors: Rest.  PRECAUTIONS: Other: Per protocol.    WEIGHT BEARING RESTRICTIONS:  Avoid right UE weight bearing.  FALLS:  Has patient fallen in last 6 months? No  LIVING ENVIRONMENT: Lives with: lives with their spouse Lives in: House/apartment Has following equipment at home: None  OCCUPATION: Disabled.  PLOF: Independent  PATIENT GOALS:Move right shoulder without pain.    NEXT MD VISIT:   OBJECTIVE:  Note: Objective measures were completed at Evaluation unless otherwise noted.  PATIENT SURVEYS:  Quick Dash 61.36.  POSTURE: Guarded.  UPPER EXTREMITY ROM:   In supine:  Very gentle right shoulder passive flexion to 70 degrees and ER 15 degrees from neutral.    PALPATION:  Scope sites appear to be healing well.  His CC of pain is in the posterior shoulder region.                                                                                                                             TREATMENT DATE:    05/26/23:                                   EXERCISE LOG  Exercise Repetitions and Resistance Comments  AA UBE 10 mins at 120 RPM's.   Pulleys 5 mins   UE Ranger Multi-directional x 5 minutes   Rows Yellow x 2 mins   Extensions Yellow x 2 mins   IR/ER With yellow theraband to fatigue x 2.   Wall Ladder 5 reps (max 31)   Flexion 1# 2 x 10 reps   Abduction 1# 2 x 10 reps   Overhead Press 1# 2 x 10 reps      Modalities  Date:  Unattended Estim: Shoulder, IFC 80-150 Hz, 15 mins, Pain Vaso: Shoulder, 34 degrees; low pressure, 15 mins, Pain      05/04/23             EXERCISE LOG    RT shldr    8 weeks 3-18  Exercise Repetitions and Resistance Comments  Pulleys 6 mins    Gentle Manual PROM Flex; ER; and ABD   UE ranger Flex/ext; CW and CCW circles x 3 mins each   Wall Ladder 5 reps (max 28)   AA Flexion Cane 2 sets of 20 reps   AA Chest Press [place] 2 sets of 20 reps   AA Overhead Press [place] 2 sets of 20 reps    Blank cell = exercise not performed today   Modalities  Date:  Unattended Estim: Shoulder, IFC 80-150 Hz, 15 mins, Pain and Tone Vaso: Shoulder, 34 degrees; low pressure, 15 mins, Pain and Edema    04/06/22:   Gentle PROM x 26 minutes per protocol guidelines for flexion, ABD , and ER. With   Vasopneumtaic on low to patient's right  shoulder x 15 minutes with pillow between thorax and elbow.   PATIENT EDUCATION: Education details: Yellow theraband IR/ER. Person educated: Patient Education method: Explanation, handout,  Education comprehension: verbalized understanding, demo HOME EXERCISE PROGRAM: As above (05/22/23).    ASSESSMENT:  CLINICAL IMPRESSION: Pt arrives for today's treatment session reporting 2/10 left shoulder pain.  Pt able to tolerate increased  time with UBE today without any discomfort.  Pt introduced to standing rows and extensions with min cues for proper technique and posture.  Pt also able to tolerate resisted shoulder flexion, abduction, and overhead press with fatigue noted.  Pt able to perform all reps asked of him.  Normal responses to estim and vaso noted upon removal.  Pt denied any pain at completion of today's treatment session.    OBJECTIVE IMPAIRMENTS: decreased activity tolerance, decreased ROM, and pain.   ACTIVITY LIMITATIONS: carrying, lifting, and reach over head  PARTICIPATION LIMITATIONS: meal prep, cleaning, laundry, and yard work  [facility] POTENTIAL: Good  CLINICAL DECISION MAKING: Stable/uncomplicated  EVALUATION COMPLEXITY: Low   GOALS: LONG TERM GOALS: Target date: 05/26/23  Ind with a HEP.  Goal status: IN PROGRESS  2.  Active right shoulder flexion to 145 degrees so the patient can easily  reach overhead.  Goal status: IN PROGRESS  3.  Active ER to 70 degrees+ to allow for easily donning/doffing of apparel.  Goal status: IN PROGRESS  4.  Perform ADL's with pain not > 3/10.:  Goal status: IN PROGRESS  PLAN:  PT FREQUENCY:  2-3 times a week  PT DURATION: 6 weeks  PLANNED INTERVENTIONS: 97110-Therapeutic exercises, 97530- Therapeutic activity, [id number]- Neuromuscular re-education, 97535- Self Care, [id number]- Manual therapy, [id number]- Electrical stimulation (unattended), 97016- Vasopneumatic device, 97035- Ultrasound, Cryotherapy, and Moist heat  PLAN FOR NEXT SESSION: Progress per protocol.    Vaso with pillow between right elbow and thorax.    Deryl Flora, PTA 05/26/2023, 9:38 AM

## 2023-06-02 ENCOUNTER — Ambulatory Visit

## 2023-06-02 DIAGNOSIS — M25512 Pain in left shoulder: Secondary | ICD-10-CM | POA: Diagnosis not present

## 2023-06-02 DIAGNOSIS — M25612 Stiffness of left shoulder, not elsewhere classified: Secondary | ICD-10-CM

## 2023-06-02 NOTE — Therapy (Addendum)
 OUTPATIENT PHYSICAL THERAPY SHOULDER TREATMENT  Patient Name: Keith Robertson MRN: [id number] DOB:Jun 06, 1965, 58 y.o., male Today's Date: 06/02/2023  END OF SESSION:  PT End of Session - 06/02/23 0800     Visit Number 10    Number of Visits 18    Date for PT Re-Evaluation 05/26/23    PT Start Time 0800    PT Stop Time 0855    PT Time Calculation (min) 55 min    Activity Tolerance Patient tolerated treatment well    Behavior During Therapy WFL for tasks assessed/performed               Past Medical History:  Diagnosis Date   Broken femur (HCC)    Broken leg    Broken neck (HCC)    Hypertension    Muscle, jerky movements (uncontrolled)    Past Surgical History:  Procedure Laterality Date   ANKLE SURGERY Right    BACK SURGERY     KNEE SURGERY Left    LEG SURGERY Left    Patient Active Problem List   Diagnosis Date Noted   Fever blister 01/26/2017   Sinusitis 01/26/2017   Pain management 04/18/2016   Closed displaced fracture of third cervical vertebra (HCC) 04/18/2016   Moderate persistent asthma without complication 04/16/2016   History of vertebral fracture 04/16/2016   Mixed hyperlipidemia 04/16/2016   Chronic allergic rhinitis 04/16/2016   Primary osteoarthritis involving multiple joints 04/16/2016   Body mass index 33.0-33.9, adult 10/22/2015   Peroneal nerve injury 10/18/2015   Nicotine dependence 12/14/2012   Subacromial impingement of left shoulder 11/08/2012   Brachial plexopathy 10/05/2012   Left shoulder pain 10/05/2012   REFERRING PROVIDER: Sammye Cristal MD  REFERRING DIAG: S/P right shoulder RCR, SAD  THERAPY DIAG:  Acute pain of left shoulder  Stiffness of left shoulder, not elsewhere classified  Rationale for Evaluation and Treatment: Rehabilitation  ONSET DATE: 03/03/23.    SUBJECTIVE:                                                                                                                                                                                       SUBJECTIVE STATEMENT: Pt reports 3/10 right shoulder pain.   PERTINENT HISTORY: See above.    PAIN:  Are you having pain? Yes: NPRS scale: 3/10. Pain location: Right shoulder. Pain description: Ache, throb. Aggravating factors: Movement. Relieving factors: Rest.  PRECAUTIONS: Other: Per protocol.    WEIGHT BEARING RESTRICTIONS:  Avoid right UE weight bearing.  FALLS:  Has patient fallen in last 6 months? No  LIVING ENVIRONMENT: Lives with: lives with their spouse Lives in: House/apartment Has following equipment at home:  None  OCCUPATION: Disabled.  PLOF: Independent  PATIENT GOALS:Move right shoulder without pain.    NEXT MD VISIT:   OBJECTIVE:  Note: Objective measures were completed at Evaluation unless otherwise noted.  PATIENT SURVEYS:  Quick Dash 61.36.  POSTURE: Guarded.  UPPER EXTREMITY ROM:   In supine:  Very gentle right shoulder passive flexion to 70 degrees and ER 15 degrees from neutral.    PALPATION:  Scope sites appear to be healing well.  His CC of pain is in the posterior shoulder region.                                                                                                                             TREATMENT DATE:    06/02/23:                                   EXERCISE LOG  Exercise Repetitions and Resistance Comments  AA UBE 10 mins at 90 RPM's.   Pulleys 5 mins   UE Ranger Multi-directional x 5 minutes   Rows Yellow x 2.5 mins   Extensions Yellow x 2.5 mins   IR/ER With yellow theraband to fatigue x 2   Punch Outs Yellow to fatigue   Flexion 1# x 20 reps   Abduction 1# x 20 reps   Scaption 1# x 20 reps   Overhead Press 1# x 20 reps      Modalities  Date:  Vaso: Shoulder, 34 degrees; low pressure, 15 mins, Pain    05/04/23             EXERCISE LOG    RT shldr    8 weeks 3-18  Exercise Repetitions and Resistance Comments  Pulleys 6 mins   Gentle Manual PROM Flex; ER; and ABD   UE  ranger Flex/ext; CW and CCW circles x 3 mins each   Wall Ladder 5 reps (max 28)   AA Flexion Cane 2 sets of 20 reps   AA Chest Press [place] 2 sets of 20 reps   AA Overhead Press [place] 2 sets of 20 reps    Blank cell = exercise not performed today   Modalities  Date:  Unattended Estim: Shoulder, IFC 80-150 Hz, 15 mins, Pain and Tone Vaso: Shoulder, 34 degrees; low pressure, 15 mins, Pain and Edema    04/06/22:   Gentle PROM x 26 minutes per protocol guidelines for flexion, ABD , and ER. With   Vasopneumtaic on low to patient's right  shoulder x 15 minutes with pillow between thorax and elbow.   PATIENT EDUCATION: Education details: Yellow theraband IR/ER. Person educated: Patient Education method: Explanation, handout,  Education comprehension: verbalized understanding, demo HOME EXERCISE PROGRAM: As above (05/22/23).    ASSESSMENT:  CLINICAL IMPRESSION: Pt arrives for today's treatment session reporting 3/10 right shoulder pain.  Pt reports that shoulder pain was 6/10 when he woke up  this morning, but is feeling better now.  Pt able to demonstrate 147 degrees of right shoulder flexion and 79 degrees of right shoulder ER, meeting both of his ROM goals.  Pt would like to concentrate on strengthening at forth coming treatment session.  Pt able to tolerate introduction to resisted scaption and punch outs with min cues for proper technique.  Normal responses to vaso noted upon removal.  Pt reported decreased pain at completion of today's treatment session.   OBJECTIVE IMPAIRMENTS: decreased activity tolerance, decreased ROM, and pain.   ACTIVITY LIMITATIONS: carrying, lifting, and reach over head  PARTICIPATION LIMITATIONS: meal prep, cleaning, laundry, and yard work  [facility] POTENTIAL: Good  CLINICAL DECISION MAKING: Stable/uncomplicated  EVALUATION COMPLEXITY: Low   GOALS: LONG TERM GOALS: Target date: 05/26/23  Ind with a HEP.  Goal status: MET  2.  Active right shoulder  flexion to 145 degrees so the patient can easily reach overhead.   4/22: 147 degrees Goal status: MET  3.  Active ER to 70 degrees+ to allow for easily donning/doffing of apparel.   4/22: 79 degrees  Goal status: MET  4.  Perform ADL's with pain not > 3/10.  4/22: Depends on the day Goal status: IN PROGRESS  PLAN:  PT FREQUENCY:  2-3 times a week  PT DURATION: 6 weeks  PLANNED INTERVENTIONS: 97110-Therapeutic exercises, 97530- Therapeutic activity, [id number]- Neuromuscular re-education, 97535- Self Care, 16109- Manual therapy, [id number]- Electrical stimulation (unattended), 97016- Vasopneumatic device, 97035- Ultrasound, Cryotherapy, and Moist heat  PLAN FOR NEXT SESSION: Progress per protocol.    Vaso with pillow between right elbow and thorax.    Deryl Flora, PTA 06/02/2023, 9:07 AM   Progress Note Reporting Period 03/31/23 to 06/02/23  See note below for Objective Data and Assessment of Progress/Goals. Patient progressing very well and LTG's #2 and 3 met.    Chad Applegate MPT

## 2023-06-04 ENCOUNTER — Encounter
